# Patient Record
Sex: Female | Born: 1935 | Race: Black or African American | Hispanic: No | Marital: Married | Smoking: Former smoker
Health system: Southern US, Community
[De-identification: ages and names within clinical notes are randomized; demographics above are authoritative.]

## PROBLEM LIST (undated history)

## (undated) DIAGNOSIS — E785 Hyperlipidemia, unspecified: Secondary | ICD-10-CM

## (undated) DIAGNOSIS — I739 Peripheral vascular disease, unspecified: Secondary | ICD-10-CM

## (undated) DIAGNOSIS — E119 Type 2 diabetes mellitus without complications: Secondary | ICD-10-CM

## (undated) DIAGNOSIS — I10 Essential (primary) hypertension: Secondary | ICD-10-CM

## (undated) HISTORY — DX: Peripheral vascular disease, unspecified: I73.9

## (undated) HISTORY — PX: CARDIAC SURGERY: SHX584

## (undated) HISTORY — PX: FEMORAL-FEMORAL BYPASS GRAFT: SHX936

## (undated) HISTORY — DX: Type 2 diabetes mellitus without complications: E11.9

## (undated) HISTORY — PX: LEG SURGERY: SHX1003

## (undated) HISTORY — DX: Hyperlipidemia, unspecified: E78.5

## (undated) HISTORY — DX: Essential (primary) hypertension: I10

## (undated) HISTORY — PX: JOINT REPLACEMENT: SHX530

---

## 2003-07-05 ENCOUNTER — Other Ambulatory Visit: Payer: Self-pay

## 2007-11-07 ENCOUNTER — Inpatient Hospital Stay: Payer: Self-pay | Admitting: Internal Medicine

## 2007-11-07 ENCOUNTER — Other Ambulatory Visit: Payer: Self-pay

## 2009-09-21 ENCOUNTER — Inpatient Hospital Stay: Payer: Self-pay | Admitting: Internal Medicine

## 2009-10-02 ENCOUNTER — Ambulatory Visit: Payer: Self-pay | Admitting: Vascular Surgery

## 2009-10-10 ENCOUNTER — Ambulatory Visit: Payer: Self-pay | Admitting: Vascular Surgery

## 2009-10-20 ENCOUNTER — Inpatient Hospital Stay: Payer: Self-pay | Admitting: Vascular Surgery

## 2009-11-20 ENCOUNTER — Emergency Department: Payer: Self-pay | Admitting: Emergency Medicine

## 2010-01-01 ENCOUNTER — Ambulatory Visit: Payer: Self-pay | Admitting: Vascular Surgery

## 2011-02-05 IMAGING — CR DG C-ARM 1-60 MIN
6 series · 15 of 16 positions shown · non-contrast
Comparison: none

[[id] new series (1 of 6)]
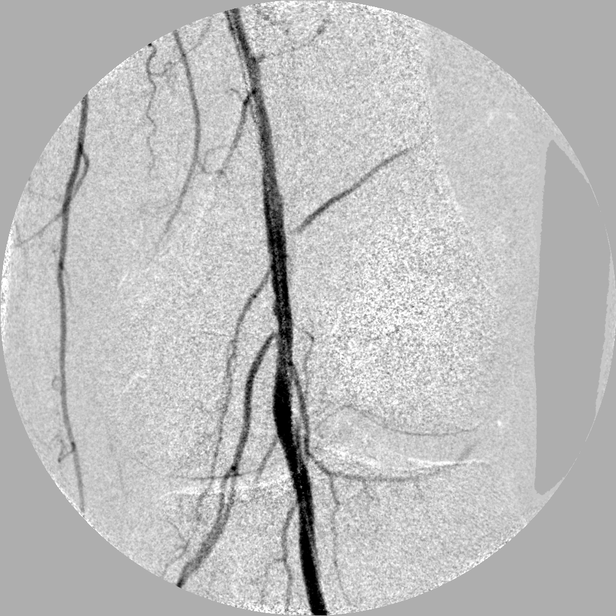

[[id] new series (2 of 6)]
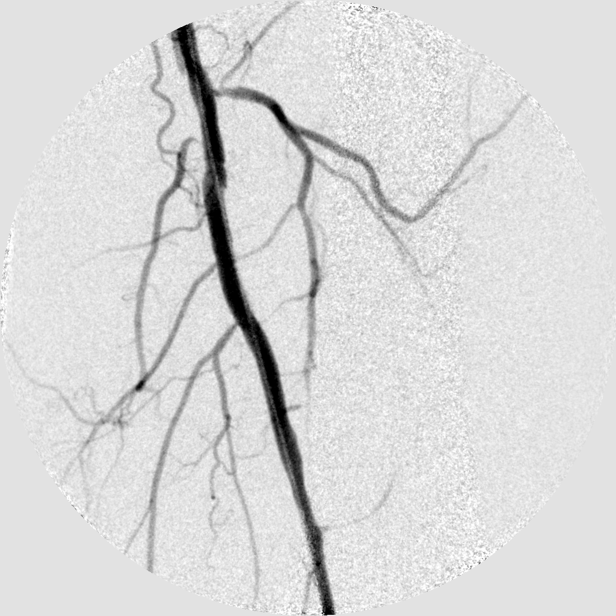

[Series 6003: (id) new series · 2 of 3 slices shown (3 of 6)]
[im 1/3]
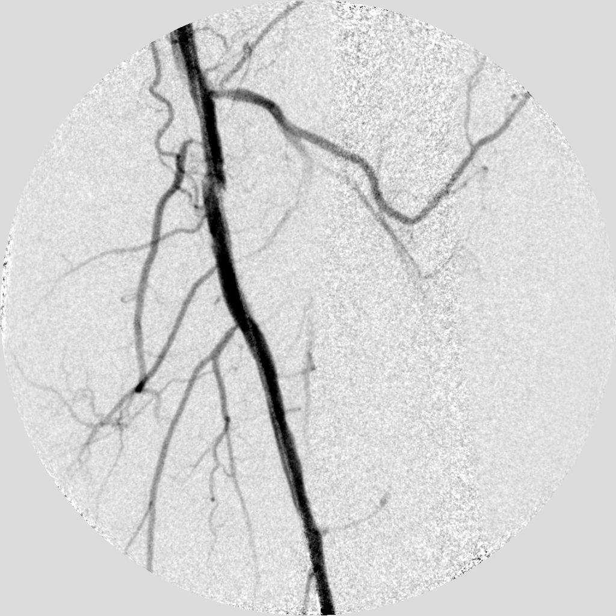
[im 3/3]
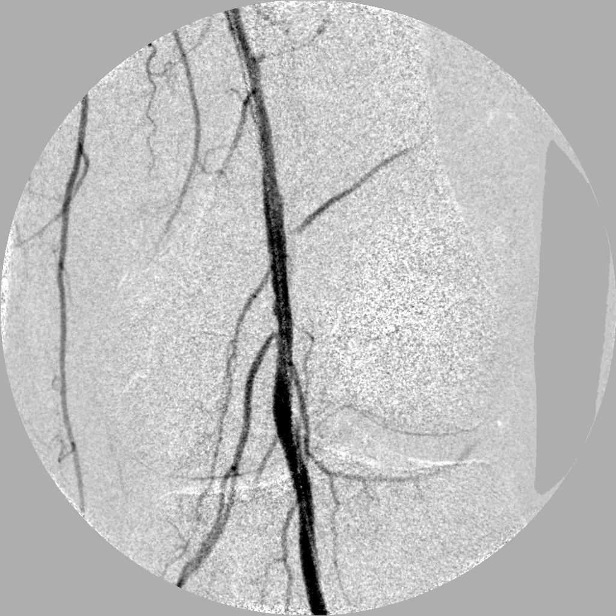

[Series 6004: (id) new series · 3 of 4 slices shown (4 of 6)]
[im 1/4]
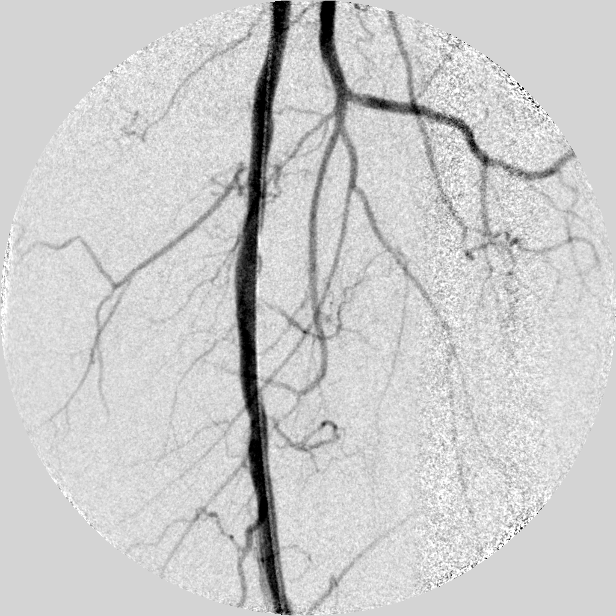
[im 2/4]
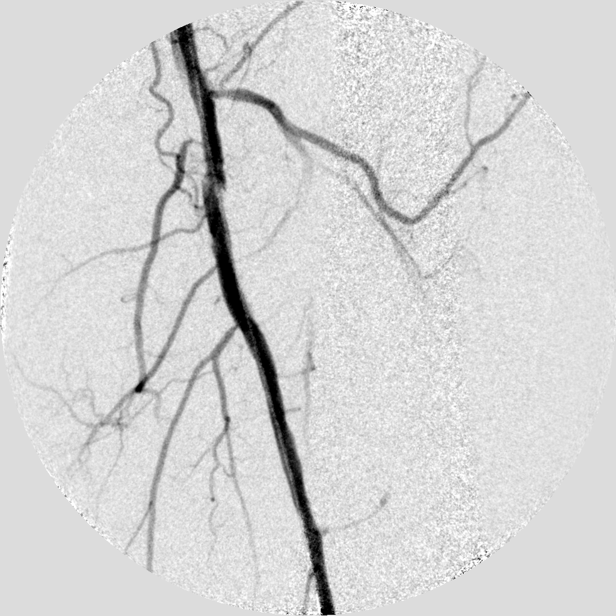
[im 4/4]
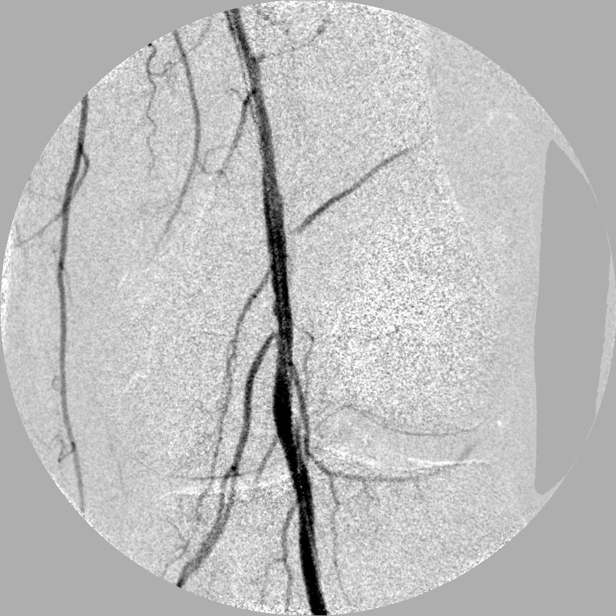

[Series 6005: (id) new series · 3 of 5 slices shown (5 of 6)]
[im 2/5]
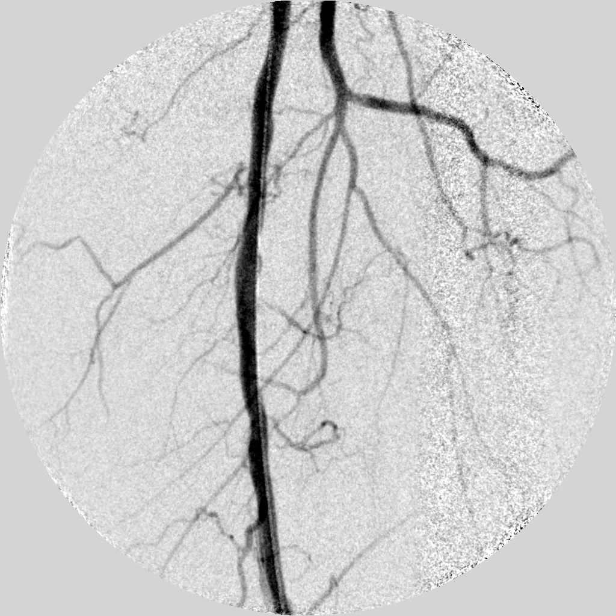
[im 3/5]
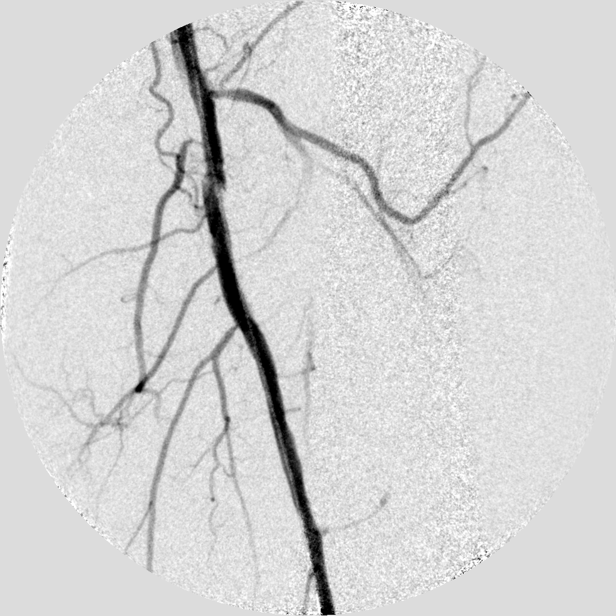
[im 5/5]
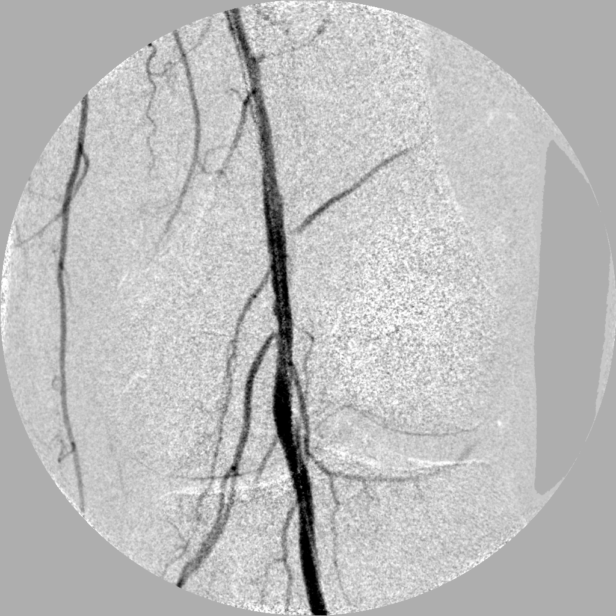

[Series 6006: (id) new series · 5 of 6 slices shown (6 of 6)]
[im 1/6]
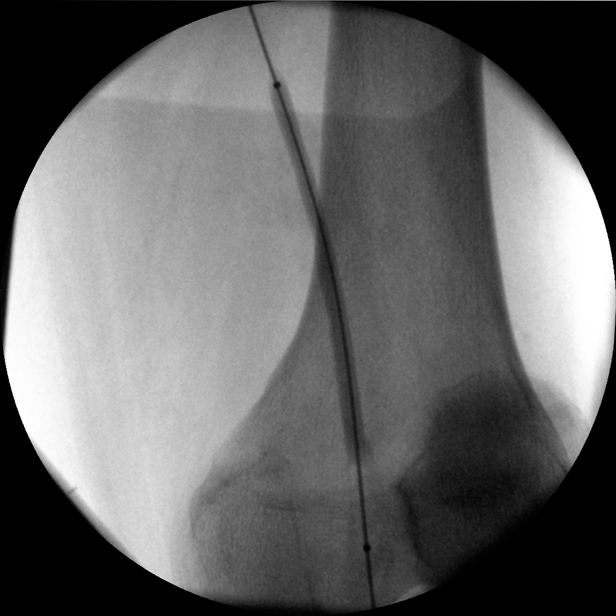
[im 2/6]
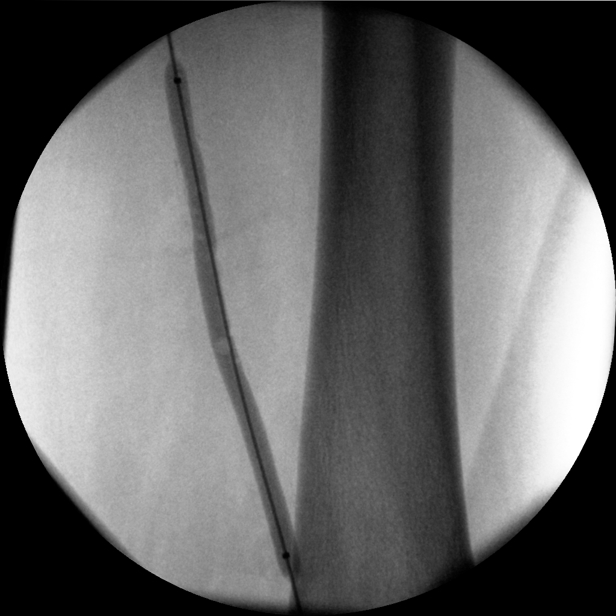
[im 3/6]
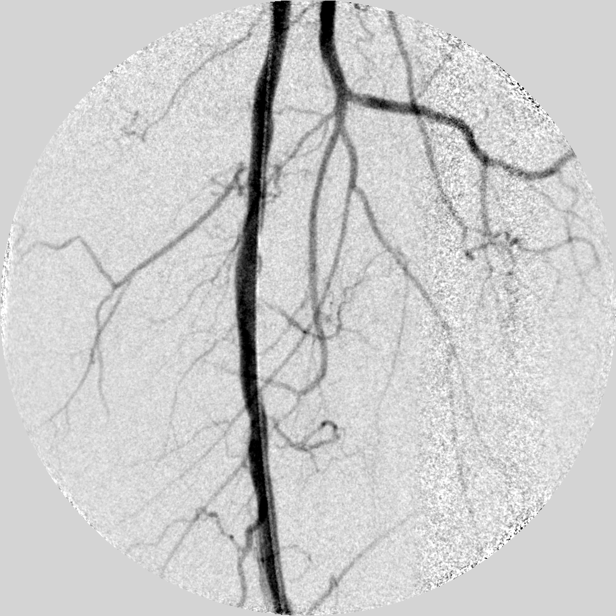
[im 4/6]
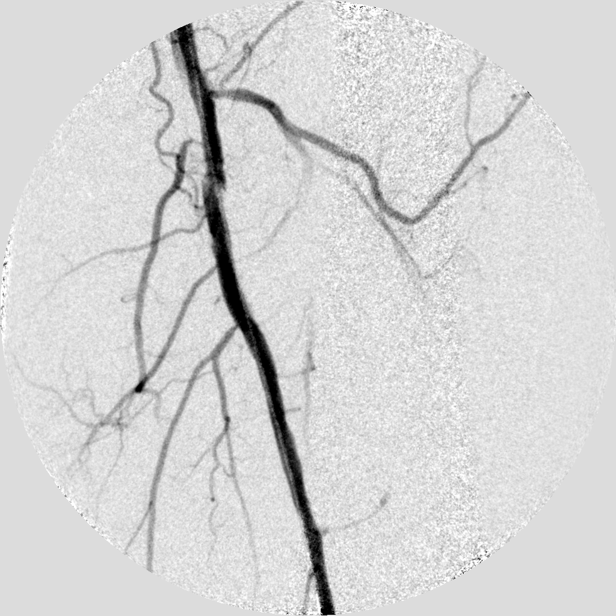
[im 6/6]
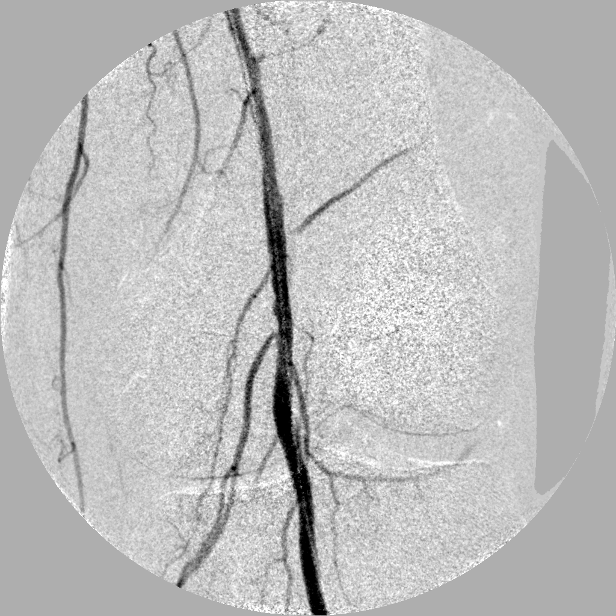

[15 of 16 positions shown; findings below may reference images not displayed]

IMAGES IMPORTED FROM THE [employer] WORKFLOW SYSTEM
NO DICTATION FOR STUDY

## 2011-02-06 ENCOUNTER — Ambulatory Visit: Payer: Self-pay | Admitting: Vascular Surgery

## 2011-07-15 ENCOUNTER — Ambulatory Visit: Payer: Self-pay | Admitting: General Practice

## 2011-07-15 LAB — URINALYSIS, COMPLETE
Bilirubin,UR: NEGATIVE
Blood: NEGATIVE
Glucose,UR: NEGATIVE mg/dL (ref 0–75)
Ph: 5 (ref 4.5–8.0)
Protein: NEGATIVE
RBC,UR: 6 /HPF (ref 0–5)
WBC UR: 8 /HPF (ref 0–5)

## 2011-07-15 LAB — BASIC METABOLIC PANEL
Anion Gap: 13 (ref 7–16)
BUN: 24 mg/dL — ABNORMAL HIGH (ref 7–18)
Chloride: 107 mmol/L (ref 98–107)
Co2: 22 mmol/L (ref 21–32)
EGFR (African American): 28 — ABNORMAL LOW
Glucose: 198 mg/dL — ABNORMAL HIGH (ref 65–99)
Potassium: 4.1 mmol/L (ref 3.5–5.1)
Sodium: 142 mmol/L (ref 136–145)

## 2011-07-15 LAB — CBC
HCT: 32.5 % — ABNORMAL LOW (ref 35.0–47.0)
HGB: 10.5 g/dL — ABNORMAL LOW (ref 12.0–16.0)
MCH: 24.5 pg — ABNORMAL LOW (ref 26.0–34.0)
MCHC: 32.4 g/dL (ref 32.0–36.0)
RDW: 15.3 % — ABNORMAL HIGH (ref 11.5–14.5)
WBC: 5 10*3/uL (ref 3.6–11.0)

## 2011-07-15 LAB — SEDIMENTATION RATE: Erythrocyte Sed Rate: 20 mm/hr (ref 0–30)

## 2011-07-15 LAB — PROTIME-INR
INR: 1
Prothrombin Time: 13.6 secs (ref 11.5–14.7)

## 2011-07-15 LAB — APTT: Activated PTT: 27 secs (ref 23.6–35.9)

## 2011-07-15 LAB — MRSA PCR SCREENING

## 2011-07-16 LAB — URINE CULTURE

## 2011-07-31 ENCOUNTER — Inpatient Hospital Stay: Payer: Self-pay | Admitting: General Practice

## 2011-08-01 LAB — BASIC METABOLIC PANEL
Chloride: 107 mmol/L (ref 98–107)
Co2: 21 mmol/L (ref 21–32)
Creatinine: 1.74 mg/dL — ABNORMAL HIGH (ref 0.60–1.30)
EGFR (Non-African Amer.): 27 — ABNORMAL LOW
Glucose: 171 mg/dL — ABNORMAL HIGH (ref 65–99)
Osmolality: 282 (ref 275–301)
Sodium: 138 mmol/L (ref 136–145)

## 2011-08-01 LAB — HEMOGLOBIN: HGB: 8.7 g/dL — ABNORMAL LOW (ref 12.0–16.0)

## 2011-08-01 LAB — PATHOLOGY REPORT

## 2011-08-01 LAB — PLATELET COUNT: Platelet: 133 10*3/uL — ABNORMAL LOW (ref 150–440)

## 2011-08-02 LAB — BASIC METABOLIC PANEL
Anion Gap: 9 (ref 7–16)
Calcium, Total: 8.9 mg/dL (ref 8.5–10.1)
Chloride: 108 mmol/L — ABNORMAL HIGH (ref 98–107)
Creatinine: 1.66 mg/dL — ABNORMAL HIGH (ref 0.60–1.30)
EGFR (African American): 33 — ABNORMAL LOW
Glucose: 128 mg/dL — ABNORMAL HIGH (ref 65–99)
Sodium: 140 mmol/L (ref 136–145)

## 2011-08-02 LAB — PLATELET COUNT: Platelet: 138 10*3/uL — ABNORMAL LOW (ref 150–440)

## 2011-08-06 ENCOUNTER — Encounter: Payer: Self-pay | Admitting: Internal Medicine

## 2011-08-19 ENCOUNTER — Encounter: Payer: Self-pay | Admitting: Internal Medicine

## 2014-06-12 NOTE — Discharge Summary (Signed)
PATIENT NAME:  Elizabeth [name], Elizabeth [name] MR#:  [id number] DATE OF BIRTH:  [birth year]  DATE OF ADMISSION:  07/31/2011 DATE OF DISCHARGE:  08/05/2011  ADDENDUM:  The patient was initially scheduled to be discharged from [facility] on Friday, 06/14, but due to bed availability not being available as expected, she was kept over the weekend and is being discharged on 06/17. There has been no change in her medical status over the weekend. There has been no change in her orders.    ____________________________ Van Clines, PA [email or web address]:bjt D: 08/05/2011 06:49:54 ET T: 08/05/2011 11:58:06 ET JOB#: [id number]  cc: Van Clines, PA, <Dictator> Evaleigh Mccamy PA ELECTRONICALLY SIGNED 08/08/2011 21:44

## 2014-06-12 NOTE — Op Note (Signed)
PATIENT NAME:  Elizabeth [name], Elizabeth [name] MR#:  [id number] DATE OF BIRTH:  [birth year]  DATE OF PROCEDURE:  07/31/2011  PREOPERATIVE DIAGNOSIS: Degenerative arthrosis of the right hip.   POSTOPERATIVE DIAGNOSIS: Degenerative arthrosis of the right hip.   PROCEDURE PERFORMED: Right total hip arthroplasty.   SURGEON: Illene Labrador. Angie Fava., MD   ASSISTANT: Van Clines, PA-C (required to maintain retraction throughout the procedure)   ANESTHESIA: Spinal.   ESTIMATED BLOOD LOSS: Minimal.   FLUIDS REPLACED: 2000 mL of crystalloid.   DRAINS: Two medium Hemovac drains.   IMPLANTS UTILIZED: DePuy 10.5 mm small stature AML femoral component, 50 mm outer diameter Pinnacle Sector II acetabular component, +4 mm 10-degree Pinnacle Marathon polyethylene liner, and a 32 mm cobalt chrome hip ball with a +1 mm neck length.   INDICATIONS FOR SURGERY: The patient is an 81 year old female who has been seen for complaints of progressive right hip and groin pain. X-rays demonstrated significant degenerative changes with bone on bone articulation. After discussion of the risks and benefits of surgical intervention, the patient expressed her understanding of the risks and benefits and agreed with plans for surgical intervention.   PROCEDURE IN DETAIL: The patient was brought into the Operating Room, and after adequate spinal anesthesia was achieved the patient was placed in the left lateral decubitus position. An axillary roll was placed and all bony prominences were well padded. The patient's right hip and leg were cleaned and prepped with alcohol and DuraPrep and draped in the usual sterile fashion. A "timeout" was performed as per usual protocol. A lateral curvilinear incision was made gently curving towards the posterior superior iliac spine. The IT band was incised in line with the skin incision, and the fibers of the gluteus maximus were split in line. The piriformis tendon was identified, skeletonized, and incised at its  insertion at the proximal femur and reflected posteriorly. In a similar fashion, the short external rotators were incised and reflected posteriorly. The gluteal sling was incised so as to better mobilize the proximal femur. A T-type posterior capsulotomy was performed. Prior to dislocation of the femoral head, a threaded Steinmann pin was inserted through a separate stab incision into the pelvis superior to the acetabulum and bent in the form of a stylus so as to assess limb length and hip offset throughout the procedure. The femoral head was then dislocated posteriorly. A posterior osteophyte was partially split with this procedure, but the posterior wall remained intact. Severe degenerative changes were noted to the femoral head. The femoral neck cut was performed using an oscillating saw. The anterior capsule was elevated off of the femoral neck. Inspection of the acetabulum also demonstrated significant degenerative changes with prominent osteophytes both anteriorly, posteriorly, and inferiorly. The acetabulum was reamed in a sequential fashion up to a 49 mm diameter. Good punctate bleeding bone was encountered. A 50 mm outer diameter Pinnacle Sector II acetabular component was positioned and impacted into place. Excellent scratch fit was appreciated. First a +4 neutral polyethylene trial was inserted and attention was directed to the proximal femur. A pilot hole for reaming of the proximal femoral canal was created using a high-speed bur. The proximal femoral canal was then reamed in a sequential fashion up to a 10 mm diameter. This allowed for greater than 6 cm of scratch fit. A 10.5 mm aggressive side-biting reamer was used to prepare the proximal femur, and a 10.5 mm small stature broach was inserted and seated at appropriate depth. The calcar region was planed accordingly,  and trial reduction was performed using a 32 mm trial with a +1 mm neck segment. Excellent equalization of limb lengths and hip offset  was appreciated. Good stability was appreciated with the exception of extremes of internal rotation, adduction, and flexion. A trial was subsequently attempted using a +4 10-degree liner with the high side positioned at approximately 7:00 position. This allowed for improved stability posteriorly. Trial components were removed. The acetabular shell was irrigated and suctioned dry. A +4 mm 10-degree Pinnacle Marathon polyethylene liner was positioned with the high side at 7:00 position and impacted into place. Next, a 10.5 mm small stature AML femoral component was positioned and impacted in place. Excellent scratch fit was appreciated. The Morse taper was cleaned and dried. A 32 mm outer diameter cobalt chrome hip ball with a +1 mm neck segment was placed on the trunnion and impacted into place. The hip was then reduced and placed through a range of motion. Excellent stability was appreciated both anteriorly and posteriorly. Good equalization of limb lengths was appreciated and good hip offset was appreciated.   The wound was irrigated with copious amounts of normal saline with antibiotic solution using pulsatile lavage and then suctioned dry. Good hemostasis was appreciated. Posterior capsulotomy was repaired using #5 Ethibond. A gluteal sling was also repaired using interrupted sutures of #5 Ethibond. Two medium drains were placed in the wound bed and brought out through a separate stab incision to be attached to a Hemovac reservoir. The IT band was repaired using interrupted sutures of #1 Vicryl. The subcutaneous tissue was approximated in layers using first #0 Vicryl, followed by #2-0 Vicryl. Skin was closed with skin staples. A sterile dressing was applied.   The patient tolerated the procedure well. She was transported to the recovery room in stable condition.    ____________________________ Illene Labrador. Angie Fava., MD jph:cbb D: 07/31/2011 10:45:53 ET T: 07/31/2011 11:27:02 ET JOB#: [id number]  cc: Fayrene Fearing  P. Angie Fava., MD, <Dictator> Illene Labrador Angie Fava MD ELECTRONICALLY SIGNED 08/09/2011 12:39

## 2014-06-12 NOTE — Discharge Summary (Signed)
PATIENT NAME:  Elizabeth, [name] MR#:  [id number] DATE OF BIRTH:  March 29, 1929  DATE OF ADMISSION:  07/31/2011 DATE OF DISCHARGE:  08/03/2011   ADMITTING DIAGNOSIS: Degenerative arthrosis of the right hip.   DISCHARGE DIAGNOSIS: Degenerative arthrosis of the right hip.   HISTORY: The patient is a pleasant 81 year old who has been followed at [facility] for progression of right hip and groin pain. She had reported approximately a two-year history of discomfort to this region. She had received intra-articular cortisone injections to the hip without any significant lasting improvement. She had complained that her pain was aggravated with weight-bearing activities although she has had some pain with rotation as well. She had noticed a decrease in her range of motion of the right hip. The patient was unable to tolerate anti-inflammatory medication due to being on Plavix. The pain had progressed to the point that it was significantly interfering with her activities of daily living. X-rays taken in the [facility] orthopedic department showed significant narrowing of the cartilage space with bone-on-bone articulation noted superiorly. There were cystic changes noted as well to the femoral head. Increased sclerotic formation was noted. She was also noted to have some osteophyte formation.   The patient stated that Dr. Wyn Quaker had stopped her Plavix as of February of this year.  She has been taking one 81-mg enteric-coated aspirin per day.   After discussion of the risks and benefits of surgical intervention, the patient expressed her understanding of the risks and benefits and agreed with plans for surgical intervention.   PROCEDURE: Right total hip arthroplasty.   ANESTHESIA: Spinal.   IMPLANTS UTILIZED: DePuy 10.5-mm small stature AML femoral component, 50-mm outer diameter Pinnacle sector-2 acetabular component, +4-mm 10-degree Pinnacle Marathon polyethylene liner, and a 32-mm cobalt chrome hip ball  with a +1-mm neck length.  HOSPITAL COURSE: The patient tolerated the procedure very well. She had no complications. She was then taken to the PAC-U where she was stabilized and then transferred to the orthopedic floor. The patient began receiving anticoagulation therapy per hospital pharmacy and anesthesia protocol. She was fitted with TED stockings bilaterally. These were allowed to be removed one hour per eight-hour shift. The patient was also fitted with the AV-I compression foot pumps bilaterally set at 80 mmHg. The patient's calves have been nontender, free of any evidence of any deep venous thromboses. Negative Homans sign. Heels were elevated off the bed using rolled towels.   The patient has denied any chest pain or shortness of breath. Vital signs have been stable. She has been afebrile. Hemodynamically she was stable. No transfusions were given.   The patient began receiving physical therapy on day one for gait training and transfers. She has been a little on the slow side with therapy and it was recommended that she go to rehab due to safety issues.  Occupational therapy was also initiated on day one for activities of daily living and assistive devices.   The patient's IV, Hemovac, and Foley were all discontinued on day two along with dressing change. The wound was free of any drainage or any signs of infection.   DISPOSITION: The patient is being discharged to a skilled nursing facility in improved stable condition.  DISCHARGE INSTRUCTIONS: 1. She is to weight bear as tolerated.  2. She will have occupational therapy for activities of daily living and assistive devices.  3. She will have physical therapy for gait training and transfers.  4. She is placed on an 1800-calorie ADA diet.  5. Encourage cough and deep breathing q. 2 hours while awake.  6. Incentive spirometer q.1 hour while awake.  7. TED stockings bilaterally. These are allowed to be removed one hour per eight-hour shift.   8. Abduction pillow between legs when in bed.  9. Elevate heels off the bed.  10. She has a follow-up appointment with Dr. Ernest Pine on 07/25 at 10:30. She is to call the clinic sooner if any temperatures of 101.5 or greater.   DRUG ALLERGIES: Demerol.   MEDICATIONS:  1. Tylenol ES 500 to 1000 mg q. 4 hours p.r.n.  2. Mylanta DS 30 mL q. 6 hours p.r.n.  3. Dulcolax suppositories 10 mg rectally daily p.r.n. for constipation.  4. Refresh optic  drops, 1 to 2 drops both eyes every four hours p.r.n. Colchicine 0.6 mg daily.  5. Senokot-S 1 tablet b.i.d. 6. Vasotec 20 mg daily.  7. Lovenox 30 mg subcutaneous every 12 hours per anesthesia protocol. Continue this for 14 days, then discontinue and begin taking one 81-mg enteric-coated aspirin.  8. Glucotrol 5 mg before breakfast.  9. Hydrochlorothiazide 12.5 mg daily.  10. Insulin sliding scale Novolin R injections. 11. Labetalol 100 mg daily.  12. Milk of Magnesia 30 mL b.i.d.  13. Toprol-XL 50 mg daily.  14. Roxicodone 5 to 10 mg q. 4 hours p.r.n. for pain. 15. Pantoprazole 40 mg b.i.d.  16. Pravachol 20 mg at bedtime.  17. Visine 0.05% optic drops, 1 to 2 drops both eyes p.r.n.  18. Tramadol 50 to 100 mg q. 4 hours p.r.n.  19. Ambien 10 mg at bedtime p.r.n.   PAST MEDICAL HISTORY:  1. Gout.  2. Osteoporosis.  3. Heart attack.  4. Ulcers.  5. Heart blockage.  6. Hypertension.  7. Diabetes.  8. Hypercholesterolemia.     ____________________________ Van Clines, PA jrw:bjt D: 08/02/2011 13:31:39 ET T: 08/02/2011 13:56:46 ET JOB#: [id number]  cc: Van Clines, PA, <Dictator> JON WOLFE PA ELECTRONICALLY SIGNED 08/08/2011 21:42

## 2016-02-23 ENCOUNTER — Encounter (INDEPENDENT_AMBULATORY_CARE_PROVIDER_SITE_OTHER): Payer: Self-pay

## 2016-02-23 ENCOUNTER — Ambulatory Visit (INDEPENDENT_AMBULATORY_CARE_PROVIDER_SITE_OTHER): Payer: Self-pay | Admitting: Vascular Surgery

## 2016-02-28 ENCOUNTER — Ambulatory Visit (INDEPENDENT_AMBULATORY_CARE_PROVIDER_SITE_OTHER): Payer: Medicare Other | Admitting: Vascular Surgery

## 2016-02-28 ENCOUNTER — Other Ambulatory Visit (INDEPENDENT_AMBULATORY_CARE_PROVIDER_SITE_OTHER): Payer: Self-pay | Admitting: Vascular Surgery

## 2016-02-28 ENCOUNTER — Encounter (INDEPENDENT_AMBULATORY_CARE_PROVIDER_SITE_OTHER): Payer: Self-pay | Admitting: Vascular Surgery

## 2016-02-28 ENCOUNTER — Other Ambulatory Visit (INDEPENDENT_AMBULATORY_CARE_PROVIDER_SITE_OTHER): Payer: Medicare Other

## 2016-02-28 ENCOUNTER — Encounter (INDEPENDENT_AMBULATORY_CARE_PROVIDER_SITE_OTHER): Payer: Medicare Other

## 2016-02-28 VITALS — BP 148/49 | HR 56 | Resp 16 | Ht 64.0 in | Wt 172.0 lb

## 2016-02-28 DIAGNOSIS — I714 Abdominal aortic aneurysm, without rupture, unspecified: Secondary | ICD-10-CM

## 2016-02-28 DIAGNOSIS — I70219 Atherosclerosis of native arteries of extremities with intermittent claudication, unspecified extremity: Secondary | ICD-10-CM | POA: Diagnosis not present

## 2016-02-28 DIAGNOSIS — I159 Secondary hypertension, unspecified: Secondary | ICD-10-CM

## 2016-02-28 DIAGNOSIS — E1151 Type 2 diabetes mellitus with diabetic peripheral angiopathy without gangrene: Secondary | ICD-10-CM

## 2016-02-28 DIAGNOSIS — I739 Peripheral vascular disease, unspecified: Secondary | ICD-10-CM | POA: Diagnosis not present

## 2016-02-28 DIAGNOSIS — E785 Hyperlipidemia, unspecified: Secondary | ICD-10-CM | POA: Diagnosis not present

## 2016-02-28 DIAGNOSIS — I70209 Unspecified atherosclerosis of native arteries of extremities, unspecified extremity: Secondary | ICD-10-CM | POA: Diagnosis not present

## 2016-02-28 NOTE — Progress Notes (Signed)
Subjective:    Patient ID: Elizabeth [name], female    DOB: 1929/11/25, 83 y.o.   MRN: [id number] Chief Complaint  Patient presents with  . Re-evaluation    Ultrasound follow up   Patient presents for a yearly lower extremity atherosclerotic disease follow up. The patient underwent an ABI which showed moderate occlusive disease bilaterally - this is stable from last year. An aorto-iliac arterial duplex is notable for patent right to left fem-fem and known AAA measuring 3.6cm - this is also stable. The patient denies any claudication like symptoms, rest pain or ulcers to her feet / toes.    Review of Systems  Constitutional: Negative.   HENT: Negative.   Eyes: Negative.   Respiratory: Negative.   Cardiovascular: Negative.   Gastrointestinal: Negative.   Endocrine: Negative.   Genitourinary: Negative.   Musculoskeletal: Negative.   Skin: Negative.   Allergic/Immunologic: Negative.   Neurological: Negative.   Hematological: Negative.   Psychiatric/Behavioral: Negative.       Objective:   Physical Exam  Constitutional: She is oriented to person, place, and time. She appears well-developed and well-nourished.  HENT:  Head: Normocephalic and atraumatic.  Right Ear: External ear normal.  Left Ear: External ear normal.  Eyes: Conjunctivae and EOM are normal. Pupils are equal, round, and reactive to light.  Neck: Normal range of motion.  Cardiovascular: Normal rate, regular rhythm, normal heart sounds and intact distal pulses.   Pulses:      Radial pulses are 2+ on the right side, and 2+ on the left side.       Dorsalis pedis pulses are 1+ on the right side, and 1+ on the left side.       Posterior tibial pulses are 1+ on the right side, and 1+ on the left side.  Pulmonary/Chest: Effort normal and breath sounds normal.  Abdominal: Soft. Bowel sounds are normal.  Musculoskeletal: Normal range of motion. She exhibits no edema.  Neurological: She is alert and oriented to person,  place, and time.  Skin: Skin is warm and dry.  Psychiatric: She has a normal mood and affect. Her behavior is normal. Judgment and thought content normal.   BP (!) 148/49   Pulse (!) 56   Resp 16   Ht 5\' 4"  (1.626 m)   Wt 172 lb (78 kg)   BMI 29.52 kg/m   Past Medical History:  Diagnosis Date  . Diabetes mellitus without complication (HCC)   . Hyperlipidemia   . Hypertension   . Peripheral arterial disease (HCC)     Social History   Social History  . Marital status: Married    Spouse name: N/A  . Number of children: N/A  . Years of education: N/A   Occupational History  . Not on file.   Social History Main Topics  . Smoking status: Former Games developer  . Smokeless tobacco: Never Used  . Alcohol use No  . Drug use: Unknown  . Sexual activity: Not on file   Other Topics Concern  . Not on file   Social History Narrative  . No narrative on file    No past surgical history on file.  Family History  Problem Relation Age of Onset  . Diabetes Mother     Allergies  Allergen Reactions  . Meperidine Nausea Only       Assessment & Plan:  Patient presents for a yearly lower extremity atherosclerotic disease follow up. The patient underwent an ABI which showed moderate occlusive disease  bilaterally - this is stable from last year. An aorto-iliac arterial duplex is notable for patent right to left fem-fem and known AAA measuring 3.6cm - this is also stable. The patient denies any claudication like symptoms, rest pain or ulcers to her feet / toes.   1. PAD (peripheral artery disease) (HCC) - Stable Patient asymptomatic with stable ABI and Aorto-iliac. No indication for intervention at this time.  Patient to remain abstinent of tobacco use. I have discussed with the patient at length the risk factors for and pathogenesis of atherosclerotic disease and encouraged a healthy diet, regular exercise regimen and blood pressure / glucose control.  The patient was encouraged to  call the office in the interim if he experiences any claudication like symptoms, rest pain or ulcers to his feet / toes.  - VAS [place] ABI WITH/WO TBI; Future - VAS US AORTA/IVC/ILIACS; Future  2. AAA (abdominal aortic aneurysm) without rupture (HCC) - Stable Patient asymptomatic with no growth on duplex. <4.0cm - yearly follow up indicated The patient has an asymptomatic abdominal aortic aneurysm that is less than 4 cm in maximal diameter.  I have reviewed the natural history of abdominal aortic aneurysm and the small risk of rupture for aneurysm less than 5 cm in size.  However, as these small aneurysms tend to enlarge over time, continued surveillance with ultrasound or CT scan is mandatory.  The patient's blood pressure is being adequately controlled however I have reviewed the importance of hypertension and lipid control and the importance of continuing his abstinence from tobacco.  The patient is also encouraged to exercise a minimum of 30 minutes 4 times a week.  Should the patient develop new onset abdominal or back pain or signs of peripheral embolization they are instructed to seek medical attention immediately and to alert the physician providing care that they have an aneurysm.  The patient voices their understanding.  3. Hyperlipidemia, unspecified hyperlipidemia type - Stable Encouraged good control as its slows the progression of atherosclerotic disease  4. 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 Secondary hypertension - Stable Encouraged good control as its slows the progression of atherosclerotic disease  No current outpatient prescriptions on file prior to visit.   No current facility-administered medications on file prior to visit.     There are no Patient Instructions on file for this visit. No Follow-up on file.   Shamia Uppal A Caidon Foti,  PA-C

## 2017-03-05 ENCOUNTER — Ambulatory Visit (INDEPENDENT_AMBULATORY_CARE_PROVIDER_SITE_OTHER): Payer: Medicare Other | Admitting: Vascular Surgery

## 2017-03-05 ENCOUNTER — Encounter (INDEPENDENT_AMBULATORY_CARE_PROVIDER_SITE_OTHER): Payer: Self-pay | Admitting: Vascular Surgery

## 2017-03-05 ENCOUNTER — Other Ambulatory Visit (INDEPENDENT_AMBULATORY_CARE_PROVIDER_SITE_OTHER): Payer: Medicare Other

## 2017-03-05 VITALS — BP 131/52 | HR 60 | Resp 16 | Wt 165.0 lb

## 2017-03-05 DIAGNOSIS — I714 Abdominal aortic aneurysm, without rupture, unspecified: Secondary | ICD-10-CM

## 2017-03-05 DIAGNOSIS — I739 Peripheral vascular disease, unspecified: Secondary | ICD-10-CM

## 2017-03-05 DIAGNOSIS — I70209 Unspecified atherosclerosis of native arteries of extremities, unspecified extremity: Secondary | ICD-10-CM | POA: Diagnosis not present

## 2017-03-05 DIAGNOSIS — E785 Hyperlipidemia, unspecified: Secondary | ICD-10-CM | POA: Diagnosis not present

## 2017-03-05 DIAGNOSIS — E1151 Type 2 diabetes mellitus with diabetic peripheral angiopathy without gangrene: Secondary | ICD-10-CM | POA: Diagnosis not present

## 2017-03-05 NOTE — Progress Notes (Signed)
Subjective:    Patient ID: Elizabeth [name], female    DOB: 01/23/1930, 84 y.o.   MRN: [id number] Chief Complaint  Patient presents with  . Follow-up    26yr abi,aorta iliac   Patient presents for a yearly peripheral artery disease follow-up.  The patient presents today without complaint.  The patient denies any claudication-like symptoms, rest pain or ulceration to the bilateral lower extremity.  The patient underwent a bilateral ABI which was notable for moderate bilateral lower extremity arterial disease.  When compared to the previous exam there is no significant change.  The patient underwent an aortoiliac arterial duplex which was notable for an abdominal aortic aneurysm measuring 3.6 cm.  This is unchanged when compared to the previous exam on February 28, 2016.  There is a patent right to left femoral bypass graft with a focal velocity increase near the distal anastomosis.  Elevated velocities in the right external iliac which may be due to a known left iliac artery occlusion.  Patient denies any fever, nausea vomiting.   Review of Systems  Constitutional: Negative.   HENT: Negative.   Eyes: Negative.   Respiratory: Negative.   Cardiovascular: Negative.   Gastrointestinal: Negative.   Endocrine: Negative.   Genitourinary: Negative.   Musculoskeletal: Negative.   Skin: Negative.   Allergic/Immunologic: Negative.   Neurological: Negative.   Hematological: Negative.   Psychiatric/Behavioral: Negative.       Objective:   Physical Exam  Constitutional: She is oriented to person, place, and time. She appears well-developed and well-nourished. No distress.  HENT:  Head: Normocephalic and atraumatic.  Eyes: Conjunctivae are normal. Pupils are equal, round, and reactive to light.  Neck: Normal range of motion.  Cardiovascular: Normal rate, regular rhythm, normal heart sounds and intact distal pulses.  Pulses:      Radial pulses are 2+ on the right side, and 2+ on the left side.     Dorsalis pedis pulses are 1+ on the right side, and 1+ on the left side.       Posterior tibial pulses are 1+ on the right side, and 1+ on the left side.  Pulmonary/Chest: Effort normal and breath sounds normal.  Musculoskeletal: Normal range of motion. She exhibits edema (Minimal nonpitting bilateral edema noted).  Neurological: She is alert and oriented to person, place, and time.  Skin: Skin is warm and dry. She is not diaphoretic.  Psychiatric: She has a normal mood and affect. Her behavior is normal. Judgment and thought content normal.  Vitals reviewed.  BP (!) 131/52 (BP Location: Right Arm)   Pulse 60   Resp 16   Wt 165 lb (74.8 kg)   BMI 28.32 kg/m   Past Medical History:  Diagnosis Date  . Diabetes mellitus without complication (HCC)   . Hyperlipidemia   . Hypertension   . Peripheral arterial disease (HCC)    Social History   Socioeconomic History  . Marital status: Married    Spouse name: Not on file  . Number of children: Not on file  . Years of education: Not on file  . Highest education level: Not on file  Social Needs  . Financial resource strain: Not on file  . Food insecurity - worry: Not on file  . Food insecurity - inability: Not on file  . Transportation needs - medical: Not on file  . Transportation needs - non-medical: Not on file  Occupational History  . Not on file  Tobacco Use  . Smoking status: Former  Smoker    Types: Cigarettes  . Smokeless tobacco: Never Used  Substance and Sexual Activity  . Alcohol use: No  . Drug use: No  . Sexual activity: Not on file  Other Topics Concern  . Not on file  Social History Narrative  . Not on file   Past Surgical History:  Procedure Laterality Date  . CARDIAC SURGERY     triple bypass  . FEMORAL-FEMORAL BYPASS GRAFT    . JOINT REPLACEMENT     right hip  . LEG SURGERY Left    leg bypass   Family History  Problem Relation Age of Onset  . Diabetes Mother    Allergies  Allergen Reactions    . Meperidine Nausea Only      Assessment & Plan:  Patient presents for a yearly peripheral artery disease follow-up.  The patient presents today without complaint.  The patient denies any claudication-like symptoms, rest pain or ulceration to the bilateral lower extremity.  The patient underwent a bilateral ABI which was notable for moderate bilateral lower extremity arterial disease.  When compared to the previous exam there is no significant change.  The patient underwent an aortoiliac arterial duplex which was notable for an abdominal aortic aneurysm measuring 3.6 cm.  This is unchanged when compared to the previous exam on February 28, 2016.  There is a patent right to left femoral bypass graft with a focal velocity increase near the distal anastomosis.  Elevated velocities in the right external iliac which may be due to a known left iliac artery occlusion.  Patient denies any fever, nausea vomiting.  1. AAA (abdominal aortic aneurysm) without rupture (HCC) - Stable Patient with a stable abdominal aortic aneurysm measuring 3.6 cm The patient denies any abdominal pain, back pain or thrombosis to the bilateral lower extremity. We discussed how hypertension control is an important aspect in controlling the growth of her abdominal aneurysm The patient expresses her understanding  2. PAD (peripheral artery disease) (HCC) - Stable The patient has known peripheral artery disease Patent right to left femorofemoral femoral bypass Patient with stable disease to the lower extremity she presents today asymptomatically. We will continue to follow on a yearly basis. The patient was encouraged to call the office sooner if she should experience any claudication-like symptoms, rest pain or ulceration to the bilateral lower extremity  - VAS [place] ABI WITH/WO TBI; Future - VAS [place] LOWER EXTREMITY ARTERIAL DUPLEX; Future  3. 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 Hyperlipidemia, unspecified hyperlipidemia type - stable Encouraged good control as its slows the progression of atherosclerotic disease   Current Outpatient Medications on File Prior to Visit  Medication Sig Dispense Refill  . aspirin EC 81 MG tablet Take by mouth.    . Calcium Carb-Cholecalciferol (CALCIUM-VITAMIN D) 500-200 MG-UNIT tablet Take by mouth.    . colchicine 0.6 MG tablet Take 2 tablets (1.2mg ) by mouth at first sign of gout flare followed by 1 tablet (0.6mg ) after 1 hour. (Max 1.8mg  within 1 hour)    . enalapril (VASOTEC) 20 MG tablet Take by mouth.    Marland Kitchen glipiZIDE (GLUCOTROL) 5 MG tablet TAKE 1 TABLET BY MOUTH EVERY MORNING BEFORE BREAKFAST.    . hydrochlorothiazide (MICROZIDE) 12.5 MG capsule TAKE 1 CAPSULE BY MOUTH ONCE DAILY.    Marland Kitchen labetalol (NORMODYNE) 100 MG tablet Take by mouth.    . metoprolol succinate (  TOPROL-XL) 50 MG 24 hr tablet TAKE 1 TABLET BY MOUTH ONCE DAILY.    . pravastatin (PRAVACHOL) 20 MG tablet Take by mouth.    . niacin (NIASPAN) 500 MG CR tablet Take by mouth.     No current facility-administered medications on file prior to visit.    There are no Patient Instructions on file for this visit. No Follow-up on file.  KIMBERLY A STEGMAYER, PA-C

## 2017-07-14 ENCOUNTER — Emergency Department: Payer: Medicare Other

## 2017-07-14 ENCOUNTER — Encounter: Payer: Self-pay | Admitting: Emergency Medicine

## 2017-07-14 ENCOUNTER — Other Ambulatory Visit: Payer: Self-pay

## 2017-07-14 ENCOUNTER — Inpatient Hospital Stay
Admission: EM | Admit: 2017-07-14 | Discharge: 2017-07-16 | DRG: 641 | Disposition: A | Discharge status: Home Health | Payer: Medicare Other | Attending: Family Medicine | Admitting: Family Medicine

## 2017-07-14 DIAGNOSIS — Z7982 Long term (current) use of aspirin: Secondary | ICD-10-CM

## 2017-07-14 DIAGNOSIS — I129 Hypertensive chronic kidney disease with stage 1 through stage 4 chronic kidney disease, or unspecified chronic kidney disease: Secondary | ICD-10-CM | POA: Diagnosis present

## 2017-07-14 DIAGNOSIS — E1122 Type 2 diabetes mellitus with diabetic chronic kidney disease: Secondary | ICD-10-CM | POA: Diagnosis present

## 2017-07-14 DIAGNOSIS — Z87891 Personal history of nicotine dependence: Secondary | ICD-10-CM | POA: Diagnosis not present

## 2017-07-14 DIAGNOSIS — I2510 Atherosclerotic heart disease of native coronary artery without angina pectoris: Secondary | ICD-10-CM | POA: Diagnosis present

## 2017-07-14 DIAGNOSIS — L8990 Pressure ulcer of unspecified site, unspecified stage: Secondary | ICD-10-CM

## 2017-07-14 DIAGNOSIS — Z8673 Personal history of transient ischemic attack (TIA), and cerebral infarction without residual deficits: Secondary | ICD-10-CM

## 2017-07-14 DIAGNOSIS — M109 Gout, unspecified: Secondary | ICD-10-CM | POA: Diagnosis present

## 2017-07-14 DIAGNOSIS — L89151 Pressure ulcer of sacral region, stage 1: Secondary | ICD-10-CM | POA: Diagnosis present

## 2017-07-14 DIAGNOSIS — E871 Hypo-osmolality and hyponatremia: Secondary | ICD-10-CM | POA: Diagnosis present

## 2017-07-14 DIAGNOSIS — N179 Acute kidney failure, unspecified: Secondary | ICD-10-CM | POA: Diagnosis present

## 2017-07-14 DIAGNOSIS — N184 Chronic kidney disease, stage 4 (severe): Secondary | ICD-10-CM | POA: Diagnosis present

## 2017-07-14 DIAGNOSIS — Z96641 Presence of right artificial hip joint: Secondary | ICD-10-CM | POA: Diagnosis present

## 2017-07-14 DIAGNOSIS — R55 Syncope and collapse: Secondary | ICD-10-CM | POA: Diagnosis not present

## 2017-07-14 DIAGNOSIS — E1151 Type 2 diabetes mellitus with diabetic peripheral angiopathy without gangrene: Secondary | ICD-10-CM | POA: Diagnosis present

## 2017-07-14 DIAGNOSIS — E785 Hyperlipidemia, unspecified: Secondary | ICD-10-CM | POA: Diagnosis present

## 2017-07-14 DIAGNOSIS — Z79899 Other long term (current) drug therapy: Secondary | ICD-10-CM

## 2017-07-14 DIAGNOSIS — E860 Dehydration: Secondary | ICD-10-CM | POA: Diagnosis present

## 2017-07-14 LAB — CBC WITH DIFFERENTIAL/PLATELET
Basophils Absolute: 0 10*3/uL (ref 0–0.1)
Basophils Relative: 1 %
Eosinophils Absolute: 0.1 10*3/uL (ref 0–0.7)
Eosinophils Relative: 1 %
HEMATOCRIT: 34.2 % — AB (ref 35.0–47.0)
HEMOGLOBIN: 11.4 g/dL — AB (ref 12.0–16.0)
LYMPHS ABS: 1.8 10*3/uL (ref 1.0–3.6)
LYMPHS PCT: 27 %
MCH: 25.7 pg — ABNORMAL LOW (ref 26.0–34.0)
MCHC: 33.3 g/dL (ref 32.0–36.0)
MCV: 77.1 fL — AB (ref 80.0–100.0)
MONOS PCT: 13 %
Monocytes Absolute: 0.9 10*3/uL (ref 0.2–0.9)
NEUTROS ABS: 3.9 10*3/uL (ref 1.4–6.5)
NEUTROS PCT: 58 %
Platelets: 182 10*3/uL (ref 150–440)
RBC: 4.43 MIL/uL (ref 3.80–5.20)
RDW: 14.5 % (ref 11.5–14.5)
WBC: 6.7 10*3/uL (ref 3.6–11.0)

## 2017-07-14 LAB — COMPREHENSIVE METABOLIC PANEL
ALBUMIN: 4.1 g/dL (ref 3.5–5.0)
ALK PHOS: 55 U/L (ref 38–126)
ALT: 21 U/L (ref 14–54)
ANION GAP: 12 (ref 5–15)
AST: 23 U/L (ref 15–41)
BUN: 43 mg/dL — ABNORMAL HIGH (ref 6–20)
CHLORIDE: 92 mmol/L — AB (ref 101–111)
CO2: 22 mmol/L (ref 22–32)
Calcium: 9.6 mg/dL (ref 8.9–10.3)
Creatinine, Ser: 2.39 mg/dL — ABNORMAL HIGH (ref 0.44–1.00)
GFR calc non Af Amer: 17 mL/min — ABNORMAL LOW (ref >60)
GFR, EST AFRICAN AMERICAN: 20 mL/min — AB (ref >60)
GLUCOSE: 97 mg/dL (ref 65–99)
Potassium: 4.3 mmol/L (ref 3.5–5.1)
Sodium: 126 mmol/L — ABNORMAL LOW (ref 135–145)
Total Bilirubin: 0.9 mg/dL (ref 0.3–1.2)
Total Protein: 6.8 g/dL (ref 6.5–8.1)

## 2017-07-14 LAB — CREATININE, SERUM
CREATININE: 2.52 mg/dL — AB (ref 0.44–1.00)
GFR calc Af Amer: 19 mL/min — ABNORMAL LOW (ref >60)
GFR calc non Af Amer: 16 mL/min — ABNORMAL LOW (ref >60)

## 2017-07-14 LAB — CBC
HCT: 35.8 % (ref 35.0–47.0)
HEMOGLOBIN: 11.9 g/dL — AB (ref 12.0–16.0)
MCH: 25.5 pg — AB (ref 26.0–34.0)
MCHC: 33.3 g/dL (ref 32.0–36.0)
MCV: 76.5 fL — AB (ref 80.0–100.0)
PLATELETS: 169 10*3/uL (ref 150–440)
RBC: 4.68 MIL/uL (ref 3.80–5.20)
RDW: 14.5 % (ref 11.5–14.5)
WBC: 5.9 10*3/uL (ref 3.6–11.0)

## 2017-07-14 LAB — TROPONIN I: Troponin I: 0.03 ng/mL (ref <0.03)

## 2017-07-14 LAB — TSH: TSH: 0.674 u[IU]/mL (ref 0.350–4.500)

## 2017-07-14 LAB — GLUCOSE, CAPILLARY: Glucose-Capillary: 146 mg/dL — ABNORMAL HIGH (ref 65–99)

## 2017-07-14 MED ORDER — COLCHICINE 0.6 MG PO TABS: 0.6000 mg | ORAL_TABLET | Freq: Every day | ORAL | Status: DC

## 2017-07-14 MED ORDER — ONDANSETRON HCL 4 MG PO TABS: 4.0000 mg | ORAL_TABLET | Freq: Four times a day (QID) | ORAL | Status: DC | PRN

## 2017-07-14 MED ORDER — LABETALOL HCL 100 MG PO TABS
100.0000 mg | ORAL_TABLET | Freq: Every day | ORAL | Status: DC
Start: 2017-07-15 — End: 2017-07-15
  Administered 2017-07-15: 100 mg via ORAL
  Filled 2017-07-14: qty 1

## 2017-07-14 MED ORDER — HEPARIN SODIUM (PORCINE) 5000 UNIT/ML IJ SOLN
5000.0000 [IU] | Freq: Three times a day (TID) | INTRAMUSCULAR | Status: DC
  Administered 2017-07-15 – 2017-07-16 (×4): 5000 [IU] via SUBCUTANEOUS
  Filled 2017-07-14 (×4): qty 1

## 2017-07-14 MED ORDER — SODIUM CHLORIDE 0.9 % IV SOLN
INTRAVENOUS | Status: DC
  Administered 2017-07-15 (×2): via INTRAVENOUS

## 2017-07-14 MED ORDER — NIACIN ER (ANTIHYPERLIPIDEMIC) 500 MG PO TBCR
500.0000 mg | EXTENDED_RELEASE_TABLET | Freq: Every day | ORAL | Status: DC
  Administered 2017-07-15 (×2): 500 mg via ORAL
  Filled 2017-07-14 (×3): qty 1

## 2017-07-14 MED ORDER — ASPIRIN EC 81 MG PO TBEC
81.0000 mg | DELAYED_RELEASE_TABLET | Freq: Every day | ORAL | Status: DC
  Administered 2017-07-15 – 2017-07-16 (×2): 81 mg via ORAL
  Filled 2017-07-14 (×2): qty 1

## 2017-07-14 MED ORDER — GLIPIZIDE 5 MG PO TABS
5.0000 mg | ORAL_TABLET | Freq: Every day | ORAL | Status: DC
  Administered 2017-07-15: 5 mg via ORAL
  Filled 2017-07-14: qty 1

## 2017-07-14 MED ORDER — SODIUM CHLORIDE 0.9 % IV BOLUS
500.0000 mL | Freq: Once | INTRAVENOUS | Status: AC
  Administered 2017-07-14: 500 mL via INTRAVENOUS

## 2017-07-14 MED ORDER — CALCIUM CARBONATE-VITAMIN D 500-200 MG-UNIT PO TABS
1.0000 | ORAL_TABLET | Freq: Two times a day (BID) | ORAL | Status: DC
  Administered 2017-07-15 – 2017-07-16 (×4): 1 via ORAL
  Filled 2017-07-14 (×4): qty 1

## 2017-07-14 MED ORDER — ACETAMINOPHEN 650 MG RE SUPP: 650.0000 mg | Freq: Four times a day (QID) | RECTAL | Status: DC | PRN

## 2017-07-14 MED ORDER — ONDANSETRON HCL 4 MG/2ML IJ SOLN: 4.0000 mg | Freq: Four times a day (QID) | INTRAMUSCULAR | Status: DC | PRN

## 2017-07-14 MED ORDER — INSULIN ASPART 100 UNIT/ML ~~LOC~~ SOLN: 0.0000 [IU] | Freq: Every day | SUBCUTANEOUS | Status: DC

## 2017-07-14 MED ORDER — EZETIMIBE 10 MG PO TABS
10.0000 mg | ORAL_TABLET | Freq: Every day | ORAL | Status: DC
  Administered 2017-07-15 – 2017-07-16 (×2): 10 mg via ORAL
  Filled 2017-07-14 (×2): qty 1

## 2017-07-14 MED ORDER — SENNOSIDES-DOCUSATE SODIUM 8.6-50 MG PO TABS: 1.0000 | ORAL_TABLET | Freq: Every evening | ORAL | Status: DC | PRN

## 2017-07-14 MED ORDER — BISACODYL 10 MG RE SUPP: 10.0000 mg | Freq: Every day | RECTAL | Status: DC | PRN

## 2017-07-14 MED ORDER — INSULIN ASPART 100 UNIT/ML ~~LOC~~ SOLN
0.0000 [IU] | Freq: Three times a day (TID) | SUBCUTANEOUS | Status: DC
  Administered 2017-07-15: 1 [IU] via SUBCUTANEOUS
  Administered 2017-07-16: 2 [IU] via SUBCUTANEOUS
  Filled 2017-07-14 (×3): qty 1

## 2017-07-14 MED ORDER — HYDROCODONE-ACETAMINOPHEN 5-325 MG PO TABS: 1.0000 | ORAL_TABLET | ORAL | Status: DC | PRN

## 2017-07-14 MED ORDER — ACETAMINOPHEN 325 MG PO TABS: 650.0000 mg | ORAL_TABLET | Freq: Four times a day (QID) | ORAL | Status: DC | PRN

## 2017-07-14 MED ORDER — FLEET ENEMA 7-19 GM/118ML RE ENEM: 1.0000 | ENEMA | Freq: Once | RECTAL | Status: DC | PRN

## 2017-07-14 MED ORDER — SODIUM CHLORIDE 1 G PO TABS
2.0000 g | ORAL_TABLET | Freq: Three times a day (TID) | ORAL | Status: AC
  Administered 2017-07-15 – 2017-07-16 (×6): 2 g via ORAL
  Filled 2017-07-14 (×6): qty 2

## 2017-07-14 NOTE — ED Notes (Signed)
Date and time results received: 07/14/17 6:49 PM   Test: Troponin Critical Value: Trop: 0.03  Name of Provider Notified: Dr. Lamont Snowball

## 2017-07-14 NOTE — H&P (Signed)
[facility] [place] [facility]   PATIENT NAME: Elizabeth [name]    MR#:  [id number]  DATE OF BIRTH:  1929-10-04  DATE OF ADMISSION:  07/14/2017  PRIMARY CARE PHYSICIAN: [name], MD   REQUESTING/REFERRING PHYSICIAN:   CHIEF COMPLAINT:   Chief Complaint  Patient presents with  . Loss of Consciousness    HISTORY OF PRESENT ILLNESS: Elizabeth [name]  is a 84 y.o. female with a known history per below, was eating with her husband this evening when she suddenly slumped over and lost consciousness, patient was placed on the floor by husband who did CPR for 1 minute, patient return to neurologic baseline, patient did not recall the events of the evening, ER work-up noted for sodium 126, chloride 92, creatinine 2.3 with baseline 1.6, troponin 0.03, chest x-ray was a poor study, CT head negative for any acute process, EKG noted for PVCs/lateral T wave inversion/old anterior MI, patient evaluated at the bedside, multiple family members present, patient no apparent distress, resting comfortably in bed, patient is now been admitted for acute hyponatremia, acute kidney injury with chronic kidney disease stage III/IV, and acute syncope.  PAST MEDICAL HISTORY:   Past Medical History:  Diagnosis Date  . Diabetes mellitus without complication (HCC)   . Hyperlipidemia   . Hypertension   . Peripheral arterial disease (HCC)     PAST SURGICAL HISTORY:  Past Surgical History:  Procedure Laterality Date  . CARDIAC SURGERY     triple bypass  . FEMORAL-FEMORAL BYPASS GRAFT    . JOINT REPLACEMENT     right hip  . LEG SURGERY Left    leg bypass    SOCIAL HISTORY:  Social History   Tobacco Use  . Smoking status: Former Smoker    Types: Cigarettes  . Smokeless tobacco: Never Used  Substance Use Topics  . Alcohol use: No    FAMILY HISTORY:  Family History  Problem Relation Age of Onset  . Diabetes Mother     DRUG ALLERGIES:  Allergies  Allergen Reactions  .  Meperidine Nausea Only    REVIEW OF SYSTEMS: Poor historian due to retrograde amnesia  CONSTITUTIONAL: No fever, fatigue or weakness.  EYES: No blurred or double vision.  EARS, NOSE, AND THROAT: No tinnitus or ear pain.  RESPIRATORY: No cough, shortness of breath, wheezing or hemoptysis.  CARDIOVASCULAR: No chest pain, orthopnea, edema.  GASTROINTESTINAL: No nausea, vomiting, diarrhea or abdominal pain.  GENITOURINARY: No dysuria, hematuria.  ENDOCRINE: No polyuria, nocturia,  HEMATOLOGY: No anemia, easy bruising or bleeding SKIN: No rash or lesion. MUSCULOSKELETAL: No joint pain or arthritis.   NEUROLOGIC: No tingling, numbness, weakness.  PSYCHIATRY: No anxiety or depression.   MEDICATIONS AT HOME:  Prior to Admission medications   Medication Sig Start Date End Date Taking? Authorizing Provider  aspirin EC 81 MG tablet Take 81 mg by mouth daily.    Yes [provider]  Calcium Carb-Cholecalciferol (CALCIUM-VITAMIN D) 500-200 MG-UNIT tablet Take 1 tablet by mouth 2 (two) times daily.    Yes [provider]  colchicine 0.6 MG tablet Take 2 tablets (1.2mg ) by mouth at first sign of gout flare followed by 1 tablet (0.6mg ) after 1 hour. (Max 1.8mg  within 1 hour) 02/09/14  Yes [provider]  enalapril (VASOTEC) 20 MG tablet Take 20 mg by mouth daily.  03/27/15  Yes [provider]  ezetimibe (ZETIA) 10 MG tablet Take 10 mg by mouth daily. 07/14/17  Yes [provider]  glipiZIDE (GLUCOTROL) 5 MG tablet TAKE 1 TABLET BY MOUTH EVERY MORNING BEFORE BREAKFAST. 02/26/16  Yes [provider]  hydrochlorothiazide (MICROZIDE) 12.5 MG capsule TAKE 1 CAPSULE BY MOUTH ONCE DAILY. 10/16/15  Yes [provider]  labetalol (NORMODYNE) 100 MG tablet Take 100 mg by mouth daily.  03/22/15  Yes [provider]  metoprolol succinate (TOPROL-XL) 50 MG 24 hr tablet TAKE 1 TABLET BY MOUTH ONCE DAILY. 07/28/15  Yes [provider]  niacin  (NIASPAN) 500 MG CR tablet Take 500 mg by mouth at bedtime.  05/04/15 07/15/18 Yes [provider]      PHYSICAL EXAMINATION:   VITAL SIGNS: Blood pressure (!) 130/52, pulse (!) 59, temperature 98 F (36.7 C), temperature source Oral, height 5\' 4"  (1.626 m), weight 80.7 kg (178 lb), SpO2 100 %.  GENERAL:  84 y.o.-year-old patient lying in the bed with no acute distress.  EYES: Pupils equal, round, reactive to light and accommodation. No scleral icterus. Extraocular muscles intact.  HEENT: Head atraumatic, normocephalic. Oropharynx and nasopharynx clear.  NECK:  Supple, no jugular venous distention. No thyroid enlargement, no tenderness.  LUNGS: Normal breath sounds bilaterally, no wheezing, rales,rhonchi or crepitation. No use of accessory muscles of respiration.  CARDIOVASCULAR: S1, S2 normal. No murmurs, rubs, or gallops.  ABDOMEN: Soft, nontender, nondistended. Bowel sounds present. No organomegaly or mass.  EXTREMITIES: No pedal edema, cyanosis, or clubbing.  NEUROLOGIC: Cranial nerves II through XII are intact. MAES. Gait not checked.  PSYCHIATRIC: The patient is alert and oriented x 3.  SKIN: No obvious rash, lesion, or ulcer.   LABORATORY PANEL:   CBC Recent Labs  Lab 07/14/17 1802  WBC 6.7  HGB 11.4*  HCT 34.2*  PLT 182  MCV 77.1*  MCH 25.7*  MCHC 33.3  RDW 14.5  LYMPHSABS 1.8  MONOABS 0.9  EOSABS 0.1  BASOSABS 0.0   ------------------------------------------------------------------------------------------------------------------  Chemistries  Recent Labs  Lab 07/14/17 1802  NA 126*  K 4.3  CL 92*  CO2 22  GLUCOSE 97  BUN 43*  CREATININE 2.39*  CALCIUM 9.6  AST 23  ALT 21  ALKPHOS 55  BILITOT 0.9   ------------------------------------------------------------------------------------------------------------------ estimated creatinine clearance is 17.4 mL/min (A) (by C-G formula based on SCr of 2.39 mg/dL  (H)). ------------------------------------------------------------------------------------------------------------------ No results for input(s): TSH, T4TOTAL, T3FREE, THYROIDAB in the last 72 hours.  Invalid input(s): FREET3   Coagulation profile No results for input(s): INR, PROTIME in the last 168 hours. ------------------------------------------------------------------------------------------------------------------- No results for input(s): DDIMER in the last 72 hours. -------------------------------------------------------------------------------------------------------------------  Cardiac Enzymes Recent Labs  Lab 07/14/17 1802  TROPONINI 0.03*   ------------------------------------------------------------------------------------------------------------------ Invalid input(s): POCBNP  ---------------------------------------------------------------------------------------------------------------  Urinalysis    Component Value Date/Time   COLORURINE Yellow 07/15/2011 1406   APPEARANCEUR Hazy 07/15/2011 1406   LABSPEC 1.012 07/15/2011 1406   PHURINE 5.0 07/15/2011 1406   GLUCOSEU Negative 07/15/2011 1406   HGBUR Negative 07/15/2011 1406   BILIRUBINUR Negative 07/15/2011 1406   KETONESUR Negative 07/15/2011 1406   PROTEINUR Negative 07/15/2011 1406   NITRITE Negative 07/15/2011 1406   LEUKOCYTESUR 3+ 07/15/2011 1406     RADIOLOGY: Ct Head Wo Contrast  Result Date: 07/14/2017 CLINICAL DATA:  Altered mental status.  Status post CPR. EXAM: CT HEAD WITHOUT CONTRAST TECHNIQUE: Contiguous axial images were obtained from the base of the skull through the vertex without intravenous contrast. COMPARISON:  None. FINDINGS: Brain: Diffusely enlarged ventricles and subarachnoid spaces. Patchy white matter low density in both cerebral hemispheres. Old right occipital and left  posterior parietal infarcts. Small, old bilateral cerebellar hemisphere infarcts. No intracranial hemorrhage,  mass lesion or CT evidence of acute infarction. Vascular: No hyperdense vessel or unexpected calcification. Skull: Small right frontal bone exostosis/calcified cephalohematoma. Otherwise, unremarkable skull. Sinuses/Orbits: Unremarkable. Other: None. IMPRESSION: 1. No acute abnormality. 2. Moderate diffuse cerebral and cerebellar atrophy. 3. Moderate chronic small vessel white matter ischemic changes in both cerebral hemispheres. 4. Old infarcts, as described above. Electronically Signed   By: Beckie Salts M.D.   On: 07/14/2017 18:31   Dg Chest Port 1 View  Result Date: 07/14/2017 CLINICAL DATA:  Status post CPR. EXAM: PORTABLE CHEST 1 VIEW COMPARISON:  10/10/2009. FINDINGS: Poor inspiration. Minimal patchy opacity at both medial lung bases. Otherwise, clear lungs. Stable post CABG changes. Thoracic spine degenerative changes. Stable mildly enlarged cardiac silhouette. IMPRESSION: Poor inspiration with minimal patchy bibasilar atelectasis or aspiration pneumonitis. Electronically Signed   By: Beckie Salts M.D.   On: 07/14/2017 18:26    EKG: Orders placed or performed during the hospital encounter of 07/14/17  . ED EKG  . ED EKG  . EKG 12-Lead  . EKG 12-Lead    IMPRESSION AND PLAN: *Acute hyponatremia Most likely secondary to medication side effect Admit to regular nursing for bed, gentle IV fluids for rehydration, hold diuretic medication, salt tablets, BMP in the morning  *Acute kidney injury with chronic kidney disease stage III/IV  most likely secondary to diuretic side effect and poor p.o. Intake Avoid nephrotoxic agents, strict I&O monitoring, hold diuretics/ACE inhibitor, BMP in the morning, gentle IV fluids for rehydration  *Acute syncope with collapse Possibly secondary to above Check echocardiogram, carotid Dopplers, IV fluids for rehydration, rule out acute coronary syndrome with cardiac enzymes x3 sets, neurochecks per routine, fall precautions, physical therapy to  evaluate/treat, check orthostatics  *Chronic diabetes mellitus type 2 Stable Sliding scale insulin with Accu-Cheks per routine   All the records are reviewed and case discussed with ED provider. Management plans discussed with the patient, family and they are in agreement.  CODE STATUS:full    TOTAL TIME TAKING CARE OF THIS PATIENT: 45 minutes.    Evelena Asa Salary M.D on 07/14/2017   Between 7am to 6pm - Pager - [phone]  After 6pm go to [email or web address] - password [email or web address]  [facility]  Office  [phone]  CC: Primary care physician; [name], MD   Note: This dictation was prepared with Dragon dictation along with smaller phrase technology. Any transcriptional errors that result from this process are unintentional.

## 2017-07-14 NOTE — ED Triage Notes (Signed)
Pt ems from home for loss of consciousness. Per ems pt became unresponsive at dinner table and husband did approx 1 min of cpr before pt responded. Pt has no memory of event. Pt seems a/o now and states that she feels normal.

## 2017-07-14 NOTE — ED Provider Notes (Signed)
[facility] Emergency Department Provider Note  ____________________________________________   First MD Initiated Contact with Patient 07/14/17 1743     (approximate)  I have reviewed the triage vital signs and the nursing notes.   HISTORY  Chief Complaint Loss of Consciousness  Level 5 exemption history limited by the patient's clinical condition  HPI Elizabeth [name] is a 84 y.o. female who comes to the emergency department via EMS after a syncopal event while eating with her husband.  Apparently the patient was eating lunch and according to the husband she subsequently slumped over and became unresponsive.  He felt for a pulse and did not feel 1 and performed roughly 1 minute of CPR.  She subsequently awoke and has no memory of the event.  She is currently asymptomatic.  She has a complex past medical history including diabetes, dyslipidemia, hypertension, and a previous triple cardiac bypass.  She has never passed out before.  She denies headache double vision blurred vision numbness or weakness.  There is no seizure-like activity and she was not incontinent to urine or feces.  Past Medical History:  Diagnosis Date  . Diabetes mellitus without complication (HCC)   . Hyperlipidemia   . Hypertension   . Peripheral arterial disease [facility])     Patient Active Problem List   Diagnosis Date Noted  . AAA (abdominal aortic aneurysm) without rupture (HCC) 02/28/2016  . PAD (peripheral artery disease) (HCC) 02/28/2016  . Hyperlipidemia 02/28/2016  . 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 Secondary hypertension 02/28/2016    Past Surgical History:  Procedure Laterality Date  . CARDIAC SURGERY     triple bypass  . FEMORAL-FEMORAL BYPASS GRAFT    . JOINT REPLACEMENT     right hip  . LEG SURGERY Left    leg bypass    Prior to Admission medications   Medication Sig Start Date End Date Taking?  Authorizing Provider  aspirin EC 81 MG tablet Take 81 mg by mouth daily.    Yes [provider]  Calcium Carb-Cholecalciferol (CALCIUM-VITAMIN D) 500-200 MG-UNIT tablet Take 1 tablet by mouth 2 (two) times daily.    Yes [provider]  colchicine 0.6 MG tablet Take 2 tablets (1.2mg ) by mouth at first sign of gout flare followed by 1 tablet (0.6mg ) after 1 hour. (Max 1.8mg  within 1 hour) 02/09/14  Yes [provider]  enalapril (VASOTEC) 20 MG tablet Take 20 mg by mouth daily.  03/27/15  Yes [provider]  ezetimibe (ZETIA) 10 MG tablet Take 10 mg by mouth daily. 07/14/17  Yes [provider]  glipiZIDE (GLUCOTROL) 5 MG tablet TAKE 1 TABLET BY MOUTH EVERY MORNING BEFORE BREAKFAST. 02/26/16  Yes [provider]  hydrochlorothiazide (MICROZIDE) 12.5 MG capsule TAKE 1 CAPSULE BY MOUTH ONCE DAILY. 10/16/15  Yes [provider]  labetalol (NORMODYNE) 100 MG tablet Take 100 mg by mouth daily.  03/22/15  Yes [provider]  metoprolol succinate (TOPROL-XL) 50 MG 24 hr tablet TAKE 1 TABLET BY MOUTH ONCE DAILY. 07/28/15  Yes [provider]  niacin (NIASPAN) 500 MG CR tablet Take 500 mg by mouth at bedtime.  05/04/15 07/15/18 Yes [provider]    Allergies Meperidine  Family History  Problem Relation Age of Onset  . Diabetes Mother     Social History Social History   Tobacco Use  . Smoking status: Former Smoker    Types: Cigarettes  . Smokeless  tobacco: Never Used  Substance Use Topics  . Alcohol use: No  . Drug use: No    Review of Systems Level 5 exemption history limited by the patient's clinical condition  ____________________________________________   PHYSICAL EXAM:  VITAL SIGNS: ED Triage Vitals  Enc Vitals Group     BP      Pulse      Resp      Temp      Temp src      SpO2      Weight      Height      Head Circumference      Peak Flow      Pain Score      Pain Loc      Pain Edu?        Excl. in GC?     Constitutional: Alert and oriented x4 pleasant cooperative speaks in full clear sentences no diaphoresis Eyes: PERRL EOMI. Head: Atraumatic. Nose: No congestion/rhinnorhea. Mouth/Throat: No trismus no bites to her tongue Neck: No stridor.   Cardiovascular: Normal rate, regular rhythm. Grossly normal heart sounds.  Good peripheral circulation. Respiratory: Normal respiratory effort.  No retractions. Lungs CTAB and moving good air Gastrointestinal: Soft nontender Musculoskeletal: No lower extremity edema legs equal in size Neurologic:  Normal speech and language. No gross focal neurologic deficits are appreciated. Skin:  Skin is warm, dry and intact. No rash noted. Psychiatric: Mood and affect are normal. Speech and behavior are normal.    ____________________________________________   DIFFERENTIAL includes but not limited to  Cardiogenic syncope, vasovagal syncope, overt beta-blockade, ventricular tachycardia, seizure ____________________________________________   LABS (all labs ordered are listed, but only abnormal results are displayed)  Labs Reviewed  COMPREHENSIVE METABOLIC PANEL - Abnormal; Notable for the following components:      Result Value   Sodium 126 (*)    Chloride 92 (*)    BUN 43 (*)    Creatinine, Ser 2.39 (*)    GFR calc non Af Amer 17 (*)    GFR calc Af Amer 20 (*)    All other components within normal limits  TROPONIN I - Abnormal; Notable for the following components:   Troponin I 0.03 (*)    All other components within normal limits  CBC WITH DIFFERENTIAL/PLATELET - Abnormal; Notable for the following components:   Hemoglobin 11.4 (*)    HCT 34.2 (*)    MCV 77.1 (*)    MCH 25.7 (*)    All other components within normal limits  BRAIN NATRIURETIC PEPTIDE  URINALYSIS, COMPLETE (UACMP) WITH MICROSCOPIC    Lab work reviewed by me with slight acute kidney injury __________________________________________  EKG  ED ECG  REPORT I, Merrily Brittle, the attending physician, personally viewed and interpreted this ECG.  Date: 07/14/2017 EKG Time:  Rate: 69 Rhythm: normal sinus rhythm QRS Axis: normal Intervals: normal ST/T Wave abnormalities: normal Narrative Interpretation: Normal sinus rhythm with multiple premature ventricular contractions with at least 2 morphologies  ____________________________________________  RADIOLOGY  Chest x-ray reviewed by me with mild atelectasis Head CT reviewed by me with no acute disease ____________________________________________   PROCEDURES  Procedure(s) performed: no  .Critical Care Performed by: Merrily Brittle, MD Authorized by: Merrily Brittle, MD   Critical care provider statement:    Critical care time (minutes):  30   Critical care time was exclusive of:  Separately billable procedures and treating other patients   Critical care was necessary to treat or prevent imminent or life-threatening deterioration of  the following conditions:  Cardiac failure   Critical care was time spent personally by me on the following activities:  Development of treatment plan with patient or surrogate, discussions with consultants, evaluation of patient's response to treatment, examination of patient, obtaining history from patient or surrogate, ordering and performing treatments and interventions, ordering and review of laboratory studies, ordering and review of radiographic studies, pulse oximetry, re-evaluation of patient's condition and review of old charts    Critical Care performed: Yes  Observation: no ____________________________________________   INITIAL IMPRESSION / ASSESSMENT AND PLAN / ED COURSE  Pertinent labs & imaging results that were available during my care of the patient were reviewed by me and considered in my medical decision making (see chart for details).  The patient arrives asymptomatic and well-appearing however she gives a very concerning  story for cardiogenic syncope.  She has no crepitus across her chest.  No obvious evidence of seizure however given the severity of the event head CT chest x-ray and broad labs are pending.  First EKG with multiple PVCs with multiple morphologies.     ----------------------------------------- 6:44 PM on 07/14/2017 -----------------------------------------  The patient just had a run of ectopy on the monitor of 8 beats with at least 6 different morphologies.  At this point it is unclear if the patient has over beta-blockade from both metoprolol and labetalol and acute kidney injury versus a ventricular dysrhythmia.  She requires inpatient admission for at least 24 hours of telemetry.  I discussed with the patient and family who verbalized understanding and agreement with plan.  I then discussed with the hospitalist Dr. Katheren Shams who has graciously agreed to admit the patient to his service. ____________________________________________   FINAL CLINICAL IMPRESSION(S) / ED DIAGNOSES  Final diagnoses:  Syncope, cardiogenic      NEW MEDICATIONS STARTED DURING THIS VISIT:  New Prescriptions   No medications on file     Note:  This document was prepared using Dragon voice recognition software and may include unintentional dictation errors.     Merrily Brittle, MD 07/14/17 2033

## 2017-07-14 NOTE — Progress Notes (Signed)
Family Meeting Note  Advance Directive:yes  Today a meeting took place with the Patient, spouse, daughter.  Patient is able to participate  The following clinical team members were present during this meeting:MD  The following were discussed:Patient's diagnosis: Diabetes, hyperlipidemia, hypertension, peripheral artery disease, coronary artery disease, history of strokes, chronic kidney disease, syncope, hyponatremia, acute kidney injury, Patient's progosis: Unable to determine and Goals for treatment: Full Code  Additional follow-up to be provided: prn  Time spent during discussion:20 minutes  Bertrum Sol, MD

## 2017-07-14 NOTE — ED Notes (Signed)
Pt assisted to bathroom, able to ambulate with assistance.

## 2017-07-15 ENCOUNTER — Inpatient Hospital Stay
Admit: 2017-07-15 | Discharge: 2017-07-15 | Disposition: A | Discharge status: Home / Self Care | Payer: Medicare Other | Attending: Family Medicine | Admitting: Family Medicine

## 2017-07-15 ENCOUNTER — Other Ambulatory Visit: Payer: Self-pay

## 2017-07-15 ENCOUNTER — Inpatient Hospital Stay: Payer: Medicare Other

## 2017-07-15 DIAGNOSIS — L8990 Pressure ulcer of unspecified site, unspecified stage: Secondary | ICD-10-CM

## 2017-07-15 LAB — BASIC METABOLIC PANEL
Anion gap: 11 (ref 5–15)
BUN: 43 mg/dL — AB (ref 6–20)
CHLORIDE: 94 mmol/L — AB (ref 101–111)
CO2: 21 mmol/L — ABNORMAL LOW (ref 22–32)
CREATININE: 2.37 mg/dL — AB (ref 0.44–1.00)
Calcium: 9.5 mg/dL (ref 8.9–10.3)
GFR calc Af Amer: 20 mL/min — ABNORMAL LOW (ref >60)
GFR, EST NON AFRICAN AMERICAN: 17 mL/min — AB (ref >60)
Glucose, Bld: 99 mg/dL (ref 65–99)
POTASSIUM: 4.2 mmol/L (ref 3.5–5.1)
Sodium: 126 mmol/L — ABNORMAL LOW (ref 135–145)

## 2017-07-15 LAB — TROPONIN I
TROPONIN I: 0.03 ng/mL — AB (ref <0.03)
Troponin I: 0.03 ng/mL (ref <0.03)
Troponin I: 0.03 ng/mL (ref <0.03)

## 2017-07-15 LAB — URINALYSIS, COMPLETE (UACMP) WITH MICROSCOPIC
BILIRUBIN URINE: NEGATIVE
Bacteria, UA: NONE SEEN
Glucose, UA: NEGATIVE mg/dL
Hgb urine dipstick: NEGATIVE
Ketones, ur: NEGATIVE mg/dL
NITRITE: NEGATIVE
PH: 6 (ref 5.0–8.0)
Protein, ur: NEGATIVE mg/dL
SPECIFIC GRAVITY, URINE: 1.008 (ref 1.005–1.030)

## 2017-07-15 LAB — HEMOGLOBIN A1C
Hgb A1c MFr Bld: 6.4 % — ABNORMAL HIGH (ref 4.8–5.6)
MEAN PLASMA GLUCOSE: 136.98 mg/dL

## 2017-07-15 LAB — GLUCOSE, CAPILLARY
GLUCOSE-CAPILLARY: 79 mg/dL (ref 65–99)
GLUCOSE-CAPILLARY: 88 mg/dL (ref 65–99)
GLUCOSE-CAPILLARY: 109 mg/dL — AB (ref 65–99)
Glucose-Capillary: 123 mg/dL — ABNORMAL HIGH (ref 65–99)

## 2017-07-15 LAB — SODIUM, URINE, RANDOM: SODIUM UR: 55 mmol/L

## 2017-07-15 MED ORDER — METOPROLOL SUCCINATE ER 50 MG PO TB24
50.0000 mg | ORAL_TABLET | Freq: Every day | ORAL | Status: DC
  Administered 2017-07-16: 50 mg via ORAL
  Filled 2017-07-15: qty 1

## 2017-07-15 MED ORDER — COLCHICINE 0.6 MG PO TABS: 0.6000 mg | ORAL_TABLET | Freq: Every day | ORAL | Status: DC | PRN

## 2017-07-15 NOTE — Progress Notes (Signed)
PT Cancellation Note  Patient Details Name: CASSIE ILG MRN: [id number] DOB: Nov 09, 1929   Cancelled Treatment:    Reason Eval/Treat Not Completed: Patient at procedure or test/unavailable.  Pt with imaging.  Will re-attempt if time allows.   Glenetta Hew, PT, DPT 07/15/2017, 11:49 AM

## 2017-07-15 NOTE — Progress Notes (Signed)
PT Cancellation Note  Patient Details Name: DASIYA HELDER MRN: [id number] DOB: 1929/09/21   Cancelled Treatment:    Reason Eval/Treat Not Completed: Patient at procedure or test/unavailable. Order received.  Chart reviewed.  Pt prepping for imaging and [place] present when PT arrived.  Will re-attempt at a later time.   Glenetta Hew, PT, DPT 07/15/2017, 9:32 AM

## 2017-07-15 NOTE — Progress Notes (Signed)
*  PRELIMINARY RESULTS* Echocardiogram 2D Echocardiogram has been performed.  Elizabeth [name] 07/15/2017, 12:11 PM

## 2017-07-15 NOTE — Progress Notes (Signed)
Patient ID: Elizabeth [name], female   DOB: 1929/07/04, 84 y.o.   MRN: [id number]  [employer] PROGRESS NOTE  ZANDRA LUY NWG:[id number] DOB: 1929/05/20 DOA: 07/14/2017 PCP: [name], MD  HPI/Subjective: Patient feeling okay and wants to go home.  Patient states that she had a gout attack and was taking colchicine with each meal.  She then started having diarrhea.  She stopped taking the colchicine.    the diarrhea settled down.  Husband yesterday had to do CPR because she had a syncopal episode and did not have a pulse and was not breathing.  When EMS arrived she was already responding and talking.  No loss of bowel or bladder function with this episode.  Objective: Vitals:   07/15/17 0718 07/15/17 0908  BP: (!) 154/67   Pulse: (!) 59 64  Resp: 18   Temp: 97.6 F (36.4 C)   SpO2: 100%     Filed Weights   07/14/17 1758 07/14/17 2212 07/15/17 0338  Weight: 80.7 kg (178 lb) 73.3 kg (161 lb 9.6 oz) 73.3 kg (161 lb 9.6 oz)    ROS: Review of Systems  Constitutional: Negative for chills and fever.  Eyes: Negative for blurred vision.  Respiratory: Negative for cough and shortness of breath.   Cardiovascular: Negative for chest pain.  Gastrointestinal: Negative for abdominal pain, constipation, diarrhea, nausea and vomiting.  Genitourinary: Negative for dysuria.  Musculoskeletal: Negative for joint pain.  Neurological: Negative for dizziness and headaches.   Exam: Physical Exam  Constitutional: She is oriented to person, place, and time.  HENT:  Nose: No mucosal edema.  Mouth/Throat: No oropharyngeal exudate or posterior oropharyngeal edema.  Eyes: Pupils are equal, round, and reactive to light. Conjunctivae, EOM and lids are normal.  Neck: No JVD present. Carotid bruit is not present. No edema present. No thyroid mass and no thyromegaly present.  Cardiovascular: S1 normal and S2 normal. Exam reveals no gallop.  No murmur heard. Pulses:      Dorsalis pedis pulses are  2+ on the right side, and 2+ on the left side.  Respiratory: No respiratory distress. She has no wheezes. She has no rhonchi. She has no rales.  GI: Soft. Bowel sounds are normal. There is no tenderness.  Musculoskeletal:       Right ankle: She exhibits no swelling.       Left ankle: She exhibits no swelling.  Lymphadenopathy:    She has no cervical adenopathy.  Neurological: She is alert and oriented to person, place, and time. No cranial nerve deficit.  Skin: Skin is warm. No rash noted. Nails show no clubbing.  Psychiatric: She has a normal mood and affect.      Data Reviewed: Basic Metabolic Panel: Recent Labs  Lab 07/14/17 1802 07/14/17 2259 07/15/17 0350  NA 126*  --  126*  K 4.3  --  4.2  CL 92*  --  94*  CO2 22  --  21*  GLUCOSE 97  --  99  BUN 43*  --  43*  CREATININE 2.39* 2.52* 2.37*  CALCIUM 9.6  --  9.5   Liver Function Tests: Recent Labs  Lab 07/14/17 1802  AST 23  ALT 21  ALKPHOS 55  BILITOT 0.9  PROT 6.8  ALBUMIN 4.1   CBC: Recent Labs  Lab 07/14/17 1802 07/14/17 2259  WBC 6.7 5.9  NEUTROABS 3.9  --   HGB 11.4* 11.9*  HCT 34.2* 35.8  MCV 77.1* 76.5*  PLT 182 169  Cardiac Enzymes: Recent Labs  Lab 07/14/17 1802 07/14/17 2259 07/15/17 0350 07/15/17 0806  TROPONINI 0.03* 0.03* 0.03* 0.03*    CBG: Recent Labs  Lab 07/14/17 2341 07/15/17 0721 07/15/17 1124 07/15/17 1615  GLUCAP 146* 79 123* 88       Studies: Ct Head Wo Contrast  Result Date: 07/14/2017 CLINICAL DATA:  Altered mental status.  Status post CPR. EXAM: CT HEAD WITHOUT CONTRAST TECHNIQUE: Contiguous axial images were obtained from the base of the skull through the vertex without intravenous contrast. COMPARISON:  None. FINDINGS: Brain: Diffusely enlarged ventricles and subarachnoid spaces. Patchy white matter low density in both cerebral hemispheres. Old right occipital and left posterior parietal infarcts. Small, old bilateral cerebellar hemisphere infarcts. No  intracranial hemorrhage, mass lesion or CT evidence of acute infarction. Vascular: No hyperdense vessel or unexpected calcification. Skull: Small right frontal bone exostosis/calcified cephalohematoma. Otherwise, unremarkable skull. Sinuses/Orbits: Unremarkable. Other: None. IMPRESSION: 1. No acute abnormality. 2. Moderate diffuse cerebral and cerebellar atrophy. 3. Moderate chronic small vessel white matter ischemic changes in both cerebral hemispheres. 4. Old infarcts, as described above. Electronically Signed   By: Beckie Salts M.D.   On: 07/14/2017 18:31   US Carotid Bilateral  Result Date: 07/15/2017 CLINICAL DATA:  Syncope. Hypertension, coronary artery disease, hyperlipidemia, diabetes. EXAM: BILATERAL CAROTID DUPLEX ULTRASOUND TECHNIQUE: Wallace Cullens scale imaging, color Doppler and duplex ultrasound was performed of bilateral carotid and vertebral arteries in the neck. COMPARISON:  None. TECHNIQUE: Quantification of carotid stenosis is based on velocity parameters that correlate the residual internal carotid diameter with NASCET-based stenosis levels, using the diameter of the distal internal carotid lumen as the denominator for stenosis measurement. The following velocity measurements were obtained: PEAK SYSTOLIC/END DIASTOLIC RIGHT ICA:                     64/8cm/sec CCA:                     62/5cm/sec SYSTOLIC ICA/CCA RATIO:  0.9 ECA:                     110cm/sec LEFT ICA:                     81/12cm/sec CCA:                     54/6cm/sec SYSTOLIC ICA/CCA RATIO:  1 ECA:                     160cm/sec FINDINGS: RIGHT CAROTID ARTERY: Mild irregular plaque in the distal common carotid artery and bulb, proximal internal and external carotid arteries. No high-grade stenosis. Normal waveforms and color Doppler signal. RIGHT VERTEBRAL ARTERY:  Normal flow direction and waveform. LEFT CAROTID ARTERY: Mild irregular plaque through the length of the common carotid artery, in the bulb and proximal ICA. No high-grade  stenosis. Normal waveforms and color Doppler signal. LEFT VERTEBRAL ARTERY: Normal flow direction and waveform. IMPRESSION: 1. Extensive nonocclusive bilateral carotid plaque resulting in less than 50% diameter stenosis. 2.  Antegrade bilateral vertebral arterial flow. Electronically Signed   By: Corlis Leak M.D.   On: 07/15/2017 10:51   Dg Chest Port 1 View  Result Date: 07/14/2017 CLINICAL DATA:  Status post CPR. EXAM: PORTABLE CHEST 1 VIEW COMPARISON:  10/10/2009. FINDINGS: Poor inspiration. Minimal patchy opacity at both medial lung bases. Otherwise, clear lungs. Stable post CABG changes. Thoracic spine degenerative changes. Stable mildly enlarged  cardiac silhouette. IMPRESSION: Poor inspiration with minimal patchy bibasilar atelectasis or aspiration pneumonitis. Electronically Signed   By: Beckie Salts M.D.   On: 07/14/2017 18:26    Scheduled Meds: . aspirin EC  81 mg Oral Daily  . calcium-vitamin D  1 tablet Oral BID  . ezetimibe  10 mg Oral Daily  . heparin  5,000 Units Subcutaneous Q8H  . insulin aspart  0-5 Units Subcutaneous QHS  . insulin aspart  0-9 Units Subcutaneous TID WC  . [START ON 07/16/2017] metoprolol succinate  50 mg Oral Daily  . niacin  500 mg Oral QHS  . sodium chloride  2 g Oral TID WC   Continuous Infusions: . sodium chloride 40 mL/hr at 07/15/17 1400    Assessment/Plan:  1. Syncope as outpatient with CPR being done.  Case discussed with Dr. Juliann Pares cardiology.  Echocardiogram still pending.  Cardiac enzymes negative.  Carotid ultrasound negative.  Monitor today on telemetry.  2. Acute kidney injury versus chronic kidney disease.  I do not have a baseline creatinine and her creatinine is pretty impaired here.  Continue to monitor with IV fluid hydration.  Likely will need nephrology as outpatient. 3. Hyponatremia could be secondary to dehydration and hydrochlorothiazide.  Hydrochlorothiazide stopped and gentle IV fluids.  Monitor sodium tomorrow morning.  Urine  sodium and urine osmolarity ordered 4. Type 2 diabetes mellitus.  Stop glipizide and monitor on sliding scale.  With the patient's creatinine glipizide is not a great medication. 5. History of gout.  Can consider starting low-dose allopurinol.  With the colchicine I would only take 1 pill/day only if she has a acute gout attack. 6. Hyperlipidemia on Zetia and niacin 7. Hypertension on metoprolol  Code Status:     Code Status Orders  (From admission, onward)        Start     Ordered   07/14/17 2204  Full code  Continuous     07/14/17 2203    Code Status History    This patient has a current code status but no historical code status.     Family Communication: Family at the bedside Disposition Plan: Potential discharge tomorrow if sodium is better  Consultants:  Dr. Juliann Pares cardiology  Time spent: 30 minutes  Montoya Brandel [employer]

## 2017-07-15 NOTE — Evaluation (Signed)
Physical Therapy Evaluation Patient Details Name: Elizabeth [name] MRN: [id number] DOB: 1929/07/04 Today's Date: 07/15/2017   History of Present Illness  Patient is an 84 year old female admitted from home for hyponatremia following an episode of syncope and collapse.  PMH includes PAD, HLD, Htn and DM.  Clinical Impression  Patient is an 84 year old female who lives in a multi story home with her husband.  She is independent with intermittent use of SPC at baseline.  Pt in bed and reports no pain upon PT arrival.  She was able to perform bed mobility mod I and sit at EOB independently.  Pt presented with fair to good strength of bilateral UE/LE and reports no sensation loss.  She was able to perform standing pivot and STS transfers with RW and CGA.  Pt ambulated 75 ft with RW and education concerning proper use of RW during turns and navigation of obstacles.  PT provided education concerning proper body mechanics for energy conservation and safety during transfers.  Pt will benefit from continued PT with focus on strength, safe use of RW, function mobility and Hep for improved strength.    Follow Up Recommendations Home health PT    Equipment Recommendations  None recommended by PT    Recommendations for Other Services       Precautions / Restrictions Precautions Precautions: Fall Restrictions Weight Bearing Restrictions: No      Mobility  Bed Mobility Overal bed mobility: Modified Independent             General bed mobility comments: Increased time and HOB elevated.  Transfers Overall transfer level: Needs assistance Equipment used: Rolling walker (2 wheeled) Transfers: Sit to/from [employer] Sit to Stand: Min guard Stand pivot transfers: Min guard       General transfer comment: Able to initiate STS without physical assist and stand on first attempt.  PT offered VC's for hand placement and safe use of RW.  Ambulation/Gait Ambulation/Gait assistance:  Min guard Ambulation Distance (Feet): 75 Feet Assistive device: Rolling walker (2 wheeled)     Gait velocity interpretation: 1.31 - 2.62 ft/sec, indicative of limited community ambulator General Gait Details: Moderate foot clearance, decreased step length and education for proper use of RW.  Pt did not report fatigue following ambulation.  Stairs            Wheelchair Mobility    Modified Rankin (Stroke Patients Only)       Balance Overall balance assessment: Modified Independent                                           Pertinent Vitals/Pain Pain Assessment: No/denies pain    Home Living Family/patient expects to be discharged to:: Private residence Living Arrangements: Spouse/significant other Available Help at Discharge: Family;Available 24 hours/day Type of Home: House Home Access: Stairs to enter Entrance Stairs-Rails: Can reach both Entrance Stairs-Number of Steps: 2 Home Layout: Two level Home Equipment: Walker - 2 wheels;Cane - single point      Prior Function Level of Independence: Independent with assistive device(s)         Comments: Pt uses SPC as needed.     Hand Dominance        Extremity/Trunk Assessment   Upper Extremity Assessment Upper Extremity Assessment: Overall WFL for tasks assessed(Grossly 4-/5 bilaterally.)    Lower Extremity Assessment Lower Extremity Assessment:  Overall [facility] for tasks assessed(Grossly 4/5 bilaterally.  No sensation loss noted.)    Cervical / Trunk Assessment Cervical / Trunk Assessment: Normal  Communication   Communication: No difficulties  Cognition Arousal/Alertness: Awake/alert Behavior During Therapy: WFL for tasks assessed/performed Overall Cognitive Status: Within Functional Limits for tasks assessed                                 General Comments: Alert and oriented x4.  Follows commands consistently.      General Comments      Exercises Other  Exercises Other Exercises: Education concerning safe use of AD and body mechanics for safe transfers.   Assessment/Plan    PT Assessment Patient needs continued PT services  PT Problem List Decreased strength;Decreased mobility;Decreased knowledge of use of DME       PT Treatment Interventions DME instruction;Therapeutic activities;Gait training;Therapeutic exercise;Stair training;Balance training;Functional mobility training;Neuromuscular re-education;Patient/family education    PT Goals (Current goals can be found in the Care Plan section)  Acute Rehab PT Goals Patient Stated Goal: To return home and back to normal level of function. PT Goal Formulation: With patient Time For Goal Achievement: 07/29/17 Potential to Achieve Goals: Good    Frequency Min 2X/week   Barriers to discharge        Co-evaluation               AM-PAC PT "6 Clicks" Daily Activity  Outcome Measure Difficulty turning over in bed (including adjusting bedclothes, sheets and blankets)?: None Difficulty moving from lying on back to sitting on the side of the bed? : A Little Difficulty sitting down on and standing up from a chair with arms (e.g., wheelchair, bedside commode, etc,.)?: A Little Help needed moving to and from a bed to chair (including a wheelchair)?: A Little Help needed walking in hospital room?: A Little Help needed climbing 3-5 steps with a railing? : A Little 6 Click Score: 19    End of Session Equipment Utilized During Treatment: Gait belt Activity Tolerance: Patient tolerated treatment well Patient left: in chair;with chair alarm set;with call bell/phone within reach Nurse Communication: Mobility status PT Visit Diagnosis: Muscle weakness (generalized) (M62.81)    Time: 1430-1500 PT Time Calculation (min) (ACUTE ONLY): 30 min   Charges:   PT Evaluation $PT Eval Low Complexity: 1 Low PT Treatments $Therapeutic Activity: 8-22 mins   PT G Codes:   PT G-Codes **NOT FOR  INPATIENT CLASS** Functional Assessment Tool Used: AM-PAC 6 Clicks Basic Mobility    Glenetta Hew, PT, DPT   Glenetta Hew 07/15/2017, 3:25 PM

## 2017-07-16 ENCOUNTER — Encounter: Payer: Self-pay | Admitting: *Deleted

## 2017-07-16 LAB — TSH: TSH: 0.744 u[IU]/mL (ref 0.350–4.500)

## 2017-07-16 LAB — BASIC METABOLIC PANEL
ANION GAP: 10 (ref 5–15)
BUN: 38 mg/dL — ABNORMAL HIGH (ref 6–20)
CO2: 19 mmol/L — ABNORMAL LOW (ref 22–32)
Calcium: 9.3 mg/dL (ref 8.9–10.3)
Chloride: 102 mmol/L (ref 101–111)
Creatinine, Ser: 2.17 mg/dL — ABNORMAL HIGH (ref 0.44–1.00)
GFR calc Af Amer: 23 mL/min — ABNORMAL LOW (ref >60)
GFR, EST NON AFRICAN AMERICAN: 19 mL/min — AB (ref >60)
GLUCOSE: 79 mg/dL (ref 65–99)
Potassium: 5.2 mmol/L — ABNORMAL HIGH (ref 3.5–5.1)
Sodium: 131 mmol/L — ABNORMAL LOW (ref 135–145)

## 2017-07-16 LAB — GLUCOSE, CAPILLARY
GLUCOSE-CAPILLARY: 68 mg/dL (ref 65–99)
GLUCOSE-CAPILLARY: 189 mg/dL — AB (ref 65–99)

## 2017-07-16 LAB — OSMOLALITY, URINE: Osmolality, Ur: 306 mOsm/kg (ref 300–900)

## 2017-07-16 MED ORDER — SODIUM CHLORIDE 1 G PO TABS: 1.0000 g | ORAL_TABLET | Freq: Three times a day (TID) | ORAL | 0 refills | Status: DC

## 2017-07-16 MED ORDER — PATIROMER SORBITEX CALCIUM 8.4 G PO PACK
8.4000 g | PACK | Freq: Once | ORAL | Status: AC
Start: 2017-07-16 — End: 2017-07-16
  Administered 2017-07-16: 8.4 g via ORAL
  Filled 2017-07-16: qty 1

## 2017-07-16 MED ORDER — COLCHICINE 0.6 MG PO TABS: 0.6000 mg | ORAL_TABLET | Freq: Every day | ORAL | 0 refills | Status: DC | PRN

## 2017-07-16 MED ORDER — SITAGLIPTIN PHOSPHATE 25 MG PO TABS: 25.0000 mg | ORAL_TABLET | Freq: Every day | ORAL | 0 refills | Status: DC

## 2017-07-16 NOTE — Plan of Care (Signed)
  Problem: Education: Goal: Knowledge of General Education information will improve Outcome: Progressing   Problem: Health Behavior/Discharge Planning: Goal: Ability to manage health-related needs will improve Outcome: Progressing   Problem: Clinical Measurements: Goal: Ability to maintain clinical measurements within normal limits will improve Outcome: Progressing Goal: Will remain free from infection Outcome: Progressing Goal: Respiratory complications will improve Outcome: Progressing Goal: Cardiovascular complication will be avoided Outcome: Progressing   Problem: Activity: Goal: Risk for activity intolerance will decrease Outcome: Progressing   Problem: Nutrition: Goal: Adequate nutrition will be maintained Outcome: Progressing   Problem: Coping: Goal: Level of anxiety will decrease Outcome: Progressing   Problem: Elimination: Goal: Will not experience complications related to bowel motility Outcome: Progressing Goal: Will not experience complications related to urinary retention Outcome: Progressing   Problem: Pain Managment: Goal: General experience of comfort will improve Outcome: Progressing   Problem: Safety: Goal: Ability to remain free from injury will improve Outcome: Progressing   Problem: Skin Integrity: Goal: Risk for impaired skin integrity will decrease Outcome: Progressing

## 2017-07-16 NOTE — Care Management (Signed)
Patient for discharge home today with home health nurse and physical therapy. No agency preference.  Referral to [facility] and accepted.  Confirmed patient's contact information.  Family to transport patient home

## 2017-07-16 NOTE — Discharge Summary (Signed)
[facility] - [place] at [facility]   PATIENT NAME: Elizabeth [name]    MR#:  [id number]  DATE OF BIRTH:  01/02/1930  DATE OF ADMISSION:  07/14/2017 ADMITTING PHYSICIAN: [name], MD  DATE OF DISCHARGE: No discharge date for patient encounter.  PRIMARY CARE PHYSICIAN: [name], MD    ADMISSION DIAGNOSIS:  Syncope, cardiogenic [R55] Hyponatremia syndrome [E87.1]  DISCHARGE DIAGNOSIS:  Active Problems:   Hyponatremia syndrome   Pressure injury of skin   SECONDARY DIAGNOSIS:   Past Medical History:  Diagnosis Date  . Diabetes mellitus without complication (HCC)   . Hyperlipidemia   . Hypertension   . Peripheral arterial disease [facility])     HOSPITAL COURSE:  *Acute syncope Resolved Most likely secondary to severe hyponatremia, dehydration, acute kidney injury with chronic kidney disease  *Acute severe hyponatremia Most likely secondary to dehydration and medication side effect Hydrochlorothiazide was discontinued, patient received IV fluids for rehydration, salt tabs, patient did well, sodium 131 at discharge  *AKI w/ CKD Improved with IV fluids for rehydration, diuretics were discontinued, lisinopril held while in house, creatinine 2.1 at discharge   *Chronic diabetes mellitus type 2  Stopped glipizide, given Cr - glipizide is not a great medication, started on Januvia, patient follow-up with primary care provider for reevaluation in 2 to 5 days  *History of gout Stable Continue allopurinol at lower dose Colchicine as needed gouty attack  *Hyperlipidemia Stable on Zetia and niacin  *Hypertension Stable on metoprolol   DISCHARGE CONDITIONS:  Stable  CONSULTS OBTAINED:  Treatment Team:  [name], MD [name], Evelena Asa, MD  DRUG ALLERGIES:   Allergies  Allergen Reactions  . Meperidine Nausea Only    DISCHARGE MEDICATIONS:   Allergies as of 07/16/2017      Reactions   Meperidine Nausea Only       Medication List    STOP taking these medications   enalapril 20 MG tablet Commonly known as:  VASOTEC   hydrochlorothiazide 12.5 MG capsule Commonly known as:  MICROZIDE   labetalol 100 MG tablet Commonly known as:  NORMODYNE     TAKE these medications   aspirin EC 81 MG tablet Take 81 mg by mouth daily.   calcium-vitamin D 500-200 MG-UNIT tablet Take 1 tablet by mouth 2 (two) times daily.   colchicine 0.6 MG tablet Take 2 tablets (1.2mg ) by mouth at first sign of gout flare followed by 1 tablet (0.6mg ) after 1 hour. (Max 1.8mg  within 1 hour)   ezetimibe 10 MG tablet Commonly known as:  ZETIA Take 10 mg by mouth daily.   glipiZIDE 5 MG tablet Commonly known as:  GLUCOTROL TAKE 1 TABLET BY MOUTH EVERY MORNING BEFORE BREAKFAST.   metoprolol succinate 50 MG 24 hr tablet Commonly known as:  TOPROL-XL TAKE 1 TABLET BY MOUTH ONCE DAILY.   niacin 500 MG CR tablet Commonly known as:  NIASPAN Take 500 mg by mouth at bedtime.   sodium chloride 1 g tablet Take 1 tablet (1 g total) by mouth 3 (three) times daily with meals.        DISCHARGE INSTRUCTIONS:   If you experience worsening of your admission symptoms, develop shortness of breath, life threatening emergency, suicidal or homicidal thoughts you must seek medical attention immediately by calling 911 or calling your MD immediately  if symptoms less severe.  You Must read complete instructions/literature along with all the possible adverse reactions/side effects for all the Medicines you take and that have  been prescribed to you. Take any new Medicines after you have completely understood and accept all the possible adverse reactions/side effects.   Please note  You were cared for by a hospitalist during your hospital stay. If you have any questions about your discharge medications or the care you received while you were in the hospital after you are discharged, you can call the unit and asked to speak with the  hospitalist on call if the hospitalist that took care of you is not available. Once you are discharged, your primary care physician will handle any further medical issues. Please note that NO REFILLS for any discharge medications will be authorized once you are discharged, as it is imperative that you return to your primary care physician (or establish a relationship with a primary care physician if you do not have one) for your aftercare needs so that they can reassess your need for medications and monitor your lab values.    Today   CHIEF COMPLAINT:   Chief Complaint  Patient presents with  . Loss of Consciousness    HISTORY OF PRESENT ILLNESS:  84 y.o. female with a known history per below, was eating with her husband this evening when she suddenly slumped over and lost consciousness, patient was placed on the floor by husband who did CPR for 1 minute, patient return to neurologic baseline, patient did not recall the events of the evening, ER work-up noted for sodium 126, chloride 92, creatinine 2.3 with baseline 1.6, troponin 0.03, chest x-ray was a poor study, CT head negative for any acute process, EKG noted for PVCs/lateral T wave inversion/old anterior MI, patient evaluated at the bedside, multiple family members present, patient no apparent distress, resting comfortably in bed, patient is now been admitted for acute hyponatremia, acute kidney injury with chronic kidney disease stage III/IV, and acute syncope.  VITAL SIGNS:  Blood pressure (!) 128/50, pulse 62, temperature 98.5 F (36.9 C), resp. rate 18, height 5\' 4"  (1.626 m), weight 71.3 kg (157 lb 3.2 oz), SpO2 92 %.  I/O:    Intake/Output Summary (Last 24 hours) at 07/16/2017 1025 Last data filed at 07/16/2017 0952 Gross per 24 hour  Intake 1967.67 ml  Output 1400 ml  Net 567.67 ml    PHYSICAL EXAMINATION:  GENERAL:  84 y.o.-year-old patient lying in the bed with no acute distress.  EYES: Pupils equal, round, reactive to  light and accommodation. No scleral icterus. Extraocular muscles intact.  HEENT: Head atraumatic, normocephalic. Oropharynx and nasopharynx clear.  NECK:  Supple, no jugular venous distention. No thyroid enlargement, no tenderness.  LUNGS: Normal breath sounds bilaterally, no wheezing, rales,rhonchi or crepitation. No use of accessory muscles of respiration.  CARDIOVASCULAR: S1, S2 normal. No murmurs, rubs, or gallops.  ABDOMEN: Soft, non-tender, non-distended. Bowel sounds present. No organomegaly or mass.  EXTREMITIES: No pedal edema, cyanosis, or clubbing.  NEUROLOGIC: Cranial nerves II through XII are intact. Muscle strength 5/5 in all extremities. Sensation intact. Gait not checked.  PSYCHIATRIC: The patient is alert and oriented x 3.  SKIN: No obvious rash, lesion, or ulcer.   DATA REVIEW:   CBC Recent Labs  Lab 07/14/17 2259  WBC 5.9  HGB 11.9*  HCT 35.8  PLT 169    Chemistries  Recent Labs  Lab 07/14/17 1802  07/16/17 0639  NA 126*   < > 131*  K 4.3   < > 5.2*  CL 92*   < > 102  CO2 22   < > 19*  GLUCOSE 97   < > 79  BUN 43*   < > 38*  CREATININE 2.39*   < > 2.17*  CALCIUM 9.6   < > 9.3  AST 23  --   --   ALT 21  --   --   ALKPHOS 55  --   --   BILITOT 0.9  --   --    < > = values in this interval not displayed.    Cardiac Enzymes Recent Labs  Lab 07/15/17 0806  TROPONINI 0.03*    Microbiology Results  No results found for this or any previous visit.  RADIOLOGY:  Ct Head Wo Contrast  Result Date: 07/14/2017 CLINICAL DATA:  Altered mental status.  Status post CPR. EXAM: CT HEAD WITHOUT CONTRAST TECHNIQUE: Contiguous axial images were obtained from the base of the skull through the vertex without intravenous contrast. COMPARISON:  None. FINDINGS: Brain: Diffusely enlarged ventricles and subarachnoid spaces. Patchy white matter low density in both cerebral hemispheres. Old right occipital and left posterior parietal infarcts. Small, old bilateral  cerebellar hemisphere infarcts. No intracranial hemorrhage, mass lesion or CT evidence of acute infarction. Vascular: No hyperdense vessel or unexpected calcification. Skull: Small right frontal bone exostosis/calcified cephalohematoma. Otherwise, unremarkable skull. Sinuses/Orbits: Unremarkable. Other: None. IMPRESSION: 1. No acute abnormality. 2. Moderate diffuse cerebral and cerebellar atrophy. 3. Moderate chronic small vessel white matter ischemic changes in both cerebral hemispheres. 4. Old infarcts, as described above. Electronically Signed   By: Beckie Salts M.D.   On: 07/14/2017 18:31   US Carotid Bilateral  Result Date: 07/15/2017 CLINICAL DATA:  Syncope. Hypertension, coronary artery disease, hyperlipidemia, diabetes. EXAM: BILATERAL CAROTID DUPLEX ULTRASOUND TECHNIQUE: Wallace Cullens scale imaging, color Doppler and duplex ultrasound was performed of bilateral carotid and vertebral arteries in the neck. COMPARISON:  None. TECHNIQUE: Quantification of carotid stenosis is based on velocity parameters that correlate the residual internal carotid diameter with NASCET-based stenosis levels, using the diameter of the distal internal carotid lumen as the denominator for stenosis measurement. The following velocity measurements were obtained: PEAK SYSTOLIC/END DIASTOLIC RIGHT ICA:                     64/8cm/sec CCA:                     62/5cm/sec SYSTOLIC ICA/CCA RATIO:  0.9 ECA:                     110cm/sec LEFT ICA:                     81/12cm/sec CCA:                     54/6cm/sec SYSTOLIC ICA/CCA RATIO:  1 ECA:                     160cm/sec FINDINGS: RIGHT CAROTID ARTERY: Mild irregular plaque in the distal common carotid artery and bulb, proximal internal and external carotid arteries. No high-grade stenosis. Normal waveforms and color Doppler signal. RIGHT VERTEBRAL ARTERY:  Normal flow direction and waveform. LEFT CAROTID ARTERY: Mild irregular plaque through the length of the common carotid artery, in the  bulb and proximal ICA. No high-grade stenosis. Normal waveforms and color Doppler signal. LEFT VERTEBRAL ARTERY: Normal flow direction and waveform. IMPRESSION: 1. Extensive nonocclusive bilateral carotid plaque resulting in less than 50% diameter stenosis. 2.  Antegrade bilateral vertebral arterial flow. Electronically Signed  By: Corlis Leak M.D.   On: 07/15/2017 10:51   Dg Chest Port 1 View  Result Date: 07/14/2017 CLINICAL DATA:  Status post CPR. EXAM: PORTABLE CHEST 1 VIEW COMPARISON:  10/10/2009. FINDINGS: Poor inspiration. Minimal patchy opacity at both medial lung bases. Otherwise, clear lungs. Stable post CABG changes. Thoracic spine degenerative changes. Stable mildly enlarged cardiac silhouette. IMPRESSION: Poor inspiration with minimal patchy bibasilar atelectasis or aspiration pneumonitis. Electronically Signed   By: Beckie Salts M.D.   On: 07/14/2017 18:26    EKG:   Orders placed or performed during the hospital encounter of 07/14/17  . ED EKG  . ED EKG  . EKG 12-Lead  . EKG 12-Lead      Management plans discussed with the patient, family and they are in agreement.  CODE STATUS:     Code Status Orders  (From admission, onward)        Start     Ordered   07/14/17 2204  Full code  Continuous     07/14/17 2203    Code Status History    This patient has a current code status but no historical code status.    Advance Directive Documentation     Most Recent Value  Type of Advance Directive  Healthcare Power of Attorney, Living will  Pre-existing out of facility DNR order (yellow form or pink MOST form)  -  "MOST" Form in Place?  -      TOTAL TIME TAKING CARE OF THIS PATIENT: 45 minutes.    Evelena Asa Markus Casten M.D on 07/16/2017 at 10:25 AM  Between 7am to 6pm - Pager - [phone]  After 6pm go to [email or web address] - password [email or web address]  [facility] [place]  Office  [phone]  CC: Primary care physician; [name], MD   Note: This dictation  was prepared with Dragon dictation along with smaller phrase technology. Any transcriptional errors that result from this process are unintentional.

## 2017-07-16 NOTE — Progress Notes (Signed)
Discharge instructions explained to pt and pts husband/ verbalized an understanding/ iv and tele removed/ RX given to pt/ will transport off  Unit via wheelchair.

## 2017-07-17 NOTE — Consult Note (Signed)
Reason for Consult: Syncope coronary artery disease borderline troponin Referring Physician: Dr Leslye Peer hospitalist,  Cardiologist Clayborn Bigness  Elizabeth [name] is an 84 y.o. female.  HPI: Patient reportedly had an episode of syncope at home witnessed by her husband.  The patient also received manual CPR by her husband he states that she quit breathing for a few minutes.  Patient also was brought in by rescue and evaluated in the emergency room she was hemodynamically stable then was admitted to telemetry with borderline troponins denies any chest pain denied any palpitations tachycardia  no worsening heart rate.  Patient complained of generalized weakness fatigue but no significant pain  Past Medical History:  Diagnosis Date  . Diabetes mellitus without complication ([place])   . Hyperlipidemia   . Hypertension   . Peripheral arterial disease [facility])     Past Surgical History:  Procedure Laterality Date  . CARDIAC SURGERY     triple bypass  . FEMORAL-FEMORAL BYPASS GRAFT    . JOINT REPLACEMENT     right hip  . LEG SURGERY Left    leg bypass    Family History  Problem Relation Age of Onset  . Diabetes Mother     Social History:  reports that she has quit smoking. Her smoking use included cigarettes. She has never used smokeless tobacco. She reports that she does not drink alcohol or use drugs.  Allergies:  Allergies  Allergen Reactions  . Meperidine Nausea Only    Medications: I have reviewed the patient's current medications.  Results for orders placed or performed during the hospital encounter of 07/14/17 (from the past 48 hour(s))  Glucose, capillary     Status: None   Collection Time: 07/15/17  4:15 PM  Result Value Ref Range   Glucose-Capillary 88 65 - 99 mg/dL  Sodium, urine, random     Status: None   Collection Time: 07/15/17  8:26 PM  Result Value Ref Range   Sodium, Ur 55 mmol/L    Comment: Performed at [facility], [address] [place], [place]  [address]  Osmolality, urine     Status: None   Collection Time: 07/15/17  8:26 PM  Result Value Ref Range   Osmolality, Ur 306 300 - 900 mOsm/kg    Comment: Performed at [facility] [address] [place], [place] [address]  Glucose, capillary     Status: Abnormal   Collection Time: 07/15/17  9:46 PM  Result Value Ref Range   Glucose-Capillary 109 (H) 65 - 99 mg/dL  Basic metabolic panel     Status: Abnormal   Collection Time: 07/16/17  6:39 AM  Result Value Ref Range   Sodium 131 (L) 135 - 145 mmol/L   Potassium 5.2 (H) 3.5 - 5.1 mmol/L   Chloride 102 101 - 111 mmol/L   CO2 19 (L) 22 - 32 mmol/L   Glucose, Bld 79 65 - 99 mg/dL   BUN 38 (H) 6 - 20 mg/dL   Creatinine, Ser 2.17 (H) 0.44 - 1.00 mg/dL   Calcium 9.3 8.9 - 10.3 mg/dL   GFR calc non Af Amer 19 (L) >60 mL/min   GFR calc Af Amer 23 (L) >60 mL/min    Comment: (NOTE) The eGFR has been calculated using the CKD EPI equation. This calculation has not been validated in all clinical situations. eGFR's persistently <60 mL/min signify possible Chronic Kidney Disease.    Anion gap 10 5 - 15    Comment: Performed at [facility], [place], [place] [address]  TSH     Status: None   Collection Time: 07/16/17  6:39 AM  Result Value Ref Range   TSH 0.744 0.350 - 4.500 uIU/mL    Comment: Performed by a 3rd Generation assay with a functional sensitivity of <=0.01 uIU/mL. Performed at [facility], [address] [place], [place] [address]   Glucose, capillary     Status: None   Collection Time: 07/16/17  7:36 AM  Result Value Ref Range   Glucose-Capillary 68 65 - 99 mg/dL   Comment 1 Notify RN   Glucose, capillary     Status: Abnormal   Collection Time: 07/16/17 11:27 AM  Result Value Ref Range   Glucose-Capillary 189 (H) 65 - 99 mg/dL   Comment 1 Notify RN     No results found.  Review of Systems  Constitutional: Positive for malaise/fatigue.  HENT: Negative.   Eyes: Negative.    Respiratory: Positive for shortness of breath.   Cardiovascular: Positive for chest pain, palpitations and claudication.  Gastrointestinal: Positive for heartburn.  Genitourinary: Negative.   Musculoskeletal: Positive for back pain, falls and myalgias.  Skin: Negative.   Neurological: Positive for dizziness and loss of consciousness.  Endo/Heme/Allergies: Negative.   Psychiatric/Behavioral: Positive for memory loss. The patient is nervous/anxious.    Blood pressure (!) 128/50, pulse 62, temperature 98.5 F (36.9 C), resp. rate 18, height 5' 4"  (1.626 m), weight 157 lb 3.2 oz (71.3 kg), SpO2 92 %. Physical Exam  Nursing note and vitals reviewed. Constitutional: She is oriented to person, place, and time. She appears well-developed and well-nourished.  HENT:  Head: Normocephalic and atraumatic.  Eyes: Pupils are equal, round, and reactive to light. Conjunctivae and EOM are normal.  Neck: Normal range of motion. Neck supple.  Cardiovascular: Normal rate.  Murmur heard. Respiratory: Effort normal and breath sounds normal.  GI: Soft. Bowel sounds are normal.  Musculoskeletal: Normal range of motion.  Neurological: She is alert and oriented to person, place, and time. She has normal reflexes.  Skin: Skin is warm.  Psychiatric: She has a normal mood and affect.    Assessment/Plan: Syncope Vertigo Coronary artery disease Peripheral vascular disease Diabetes Hyperlipidemia Chronic renal insufficiency Borderline troponins Hyponatremia . Plan Hyponatremia correction Continue diabetes management and control Consider neurology involvement Recommend Holter monitor for further assessment Follow-up with nephrology for renal insufficiency Gentle hydration hold diuretics Echocardiogram for assessment of syncope and murmur Carotid Doppler for further assessment   Dwayne D Callwood 07/17/2017, 12:14 PM

## 2017-07-22 LAB — ECHOCARDIOGRAM COMPLETE
Height: 64 in
Weight: 2585.6 oz

## 2017-07-30 ENCOUNTER — Inpatient Hospital Stay
Admission: AD | Admit: 2017-07-30 | Discharge: 2017-08-05 | DRG: 441 | Disposition: A | Discharge status: Hospice | Payer: Medicare Other | Source: Ambulatory Visit | Attending: Internal Medicine | Admitting: Internal Medicine

## 2017-07-30 ENCOUNTER — Inpatient Hospital Stay: Payer: Medicare Other

## 2017-07-30 ENCOUNTER — Other Ambulatory Visit: Payer: Self-pay

## 2017-07-30 ENCOUNTER — Encounter: Payer: Self-pay | Admitting: Specialist

## 2017-07-30 DIAGNOSIS — Z87891 Personal history of nicotine dependence: Secondary | ICD-10-CM | POA: Diagnosis not present

## 2017-07-30 DIAGNOSIS — E785 Hyperlipidemia, unspecified: Secondary | ICD-10-CM | POA: Diagnosis present

## 2017-07-30 DIAGNOSIS — E1122 Type 2 diabetes mellitus with diabetic chronic kidney disease: Secondary | ICD-10-CM | POA: Diagnosis present

## 2017-07-30 DIAGNOSIS — K8020 Calculus of gallbladder without cholecystitis without obstruction: Secondary | ICD-10-CM | POA: Diagnosis present

## 2017-07-30 DIAGNOSIS — I2510 Atherosclerotic heart disease of native coronary artery without angina pectoris: Secondary | ICD-10-CM | POA: Diagnosis present

## 2017-07-30 DIAGNOSIS — I130 Hypertensive heart and chronic kidney disease with heart failure and stage 1 through stage 4 chronic kidney disease, or unspecified chronic kidney disease: Secondary | ICD-10-CM | POA: Diagnosis not present

## 2017-07-30 DIAGNOSIS — N184 Chronic kidney disease, stage 4 (severe): Secondary | ICD-10-CM | POA: Diagnosis not present

## 2017-07-30 DIAGNOSIS — N179 Acute kidney failure, unspecified: Secondary | ICD-10-CM | POA: Diagnosis present

## 2017-07-30 DIAGNOSIS — Z95828 Presence of other vascular implants and grafts: Secondary | ICD-10-CM

## 2017-07-30 DIAGNOSIS — F0390 Unspecified dementia without behavioral disturbance: Secondary | ICD-10-CM | POA: Diagnosis present

## 2017-07-30 DIAGNOSIS — R1310 Dysphagia, unspecified: Secondary | ICD-10-CM

## 2017-07-30 DIAGNOSIS — D696 Thrombocytopenia, unspecified: Secondary | ICD-10-CM | POA: Diagnosis present

## 2017-07-30 DIAGNOSIS — G92 Toxic encephalopathy: Secondary | ICD-10-CM | POA: Diagnosis present

## 2017-07-30 DIAGNOSIS — E1151 Type 2 diabetes mellitus with diabetic peripheral angiopathy without gangrene: Secondary | ICD-10-CM | POA: Diagnosis present

## 2017-07-30 DIAGNOSIS — R791 Abnormal coagulation profile: Secondary | ICD-10-CM | POA: Diagnosis not present

## 2017-07-30 DIAGNOSIS — I255 Ischemic cardiomyopathy: Secondary | ICD-10-CM | POA: Diagnosis present

## 2017-07-30 DIAGNOSIS — Z7401 Bed confinement status: Secondary | ICD-10-CM

## 2017-07-30 DIAGNOSIS — Z951 Presence of aortocoronary bypass graft: Secondary | ICD-10-CM

## 2017-07-30 DIAGNOSIS — Z66 Do not resuscitate: Secondary | ICD-10-CM | POA: Diagnosis present

## 2017-07-30 DIAGNOSIS — E875 Hyperkalemia: Secondary | ICD-10-CM | POA: Diagnosis present

## 2017-07-30 DIAGNOSIS — D539 Nutritional anemia, unspecified: Secondary | ICD-10-CM | POA: Diagnosis not present

## 2017-07-30 DIAGNOSIS — K7200 Acute and subacute hepatic failure without coma: Secondary | ICD-10-CM | POA: Diagnosis present

## 2017-07-30 DIAGNOSIS — I501 Left ventricular failure: Secondary | ICD-10-CM | POA: Diagnosis not present

## 2017-07-30 DIAGNOSIS — K767 Hepatorenal syndrome: Principal | ICD-10-CM | POA: Diagnosis present

## 2017-07-30 DIAGNOSIS — R7989 Other specified abnormal findings of blood chemistry: Secondary | ICD-10-CM | POA: Diagnosis not present

## 2017-07-30 DIAGNOSIS — N170 Acute kidney failure with tubular necrosis: Secondary | ICD-10-CM | POA: Diagnosis not present

## 2017-07-30 DIAGNOSIS — Z79899 Other long term (current) drug therapy: Secondary | ICD-10-CM

## 2017-07-30 DIAGNOSIS — R945 Abnormal results of liver function studies: Secondary | ICD-10-CM | POA: Diagnosis not present

## 2017-07-30 DIAGNOSIS — E871 Hypo-osmolality and hyponatremia: Secondary | ICD-10-CM | POA: Diagnosis present

## 2017-07-30 DIAGNOSIS — R4182 Altered mental status, unspecified: Secondary | ICD-10-CM | POA: Diagnosis not present

## 2017-07-30 DIAGNOSIS — E860 Dehydration: Secondary | ICD-10-CM | POA: Diagnosis present

## 2017-07-30 DIAGNOSIS — Z888 Allergy status to other drugs, medicaments and biological substances status: Secondary | ICD-10-CM | POA: Diagnosis not present

## 2017-07-30 DIAGNOSIS — I5022 Chronic systolic (congestive) heart failure: Secondary | ICD-10-CM | POA: Diagnosis present

## 2017-07-30 DIAGNOSIS — Z515 Encounter for palliative care: Secondary | ICD-10-CM | POA: Diagnosis present

## 2017-07-30 DIAGNOSIS — D573 Sickle-cell trait: Secondary | ICD-10-CM | POA: Diagnosis present

## 2017-07-30 DIAGNOSIS — Z885 Allergy status to narcotic agent status: Secondary | ICD-10-CM

## 2017-07-30 DIAGNOSIS — K760 Fatty (change of) liver, not elsewhere classified: Secondary | ICD-10-CM | POA: Diagnosis present

## 2017-07-30 DIAGNOSIS — Z833 Family history of diabetes mellitus: Secondary | ICD-10-CM

## 2017-07-30 DIAGNOSIS — R0602 Shortness of breath: Secondary | ICD-10-CM

## 2017-07-30 DIAGNOSIS — Z7982 Long term (current) use of aspirin: Secondary | ICD-10-CM

## 2017-07-30 DIAGNOSIS — B179 Acute viral hepatitis, unspecified: Secondary | ICD-10-CM | POA: Diagnosis present

## 2017-07-30 DIAGNOSIS — Z96641 Presence of right artificial hip joint: Secondary | ICD-10-CM | POA: Diagnosis present

## 2017-07-30 DIAGNOSIS — Z7984 Long term (current) use of oral hypoglycemic drugs: Secondary | ICD-10-CM

## 2017-07-30 DIAGNOSIS — E872 Acidosis: Secondary | ICD-10-CM | POA: Diagnosis present

## 2017-07-30 DIAGNOSIS — L89151 Pressure ulcer of sacral region, stage 1: Secondary | ICD-10-CM | POA: Diagnosis present

## 2017-07-30 LAB — BLOOD GAS, ARTERIAL
Acid-base deficit: 9.5 mmol/L — ABNORMAL HIGH (ref 0.0–2.0)
Bicarbonate: 13.3 mmol/L — ABNORMAL LOW (ref 20.0–28.0)
FIO2: 0.21
O2 SAT: 98 %
PATIENT TEMPERATURE: 37
pCO2 arterial: 21 mmHg — ABNORMAL LOW (ref 32.0–48.0)
pH, Arterial: 7.41 (ref 7.350–7.450)
pO2, Arterial: 103 mmHg (ref 83.0–108.0)

## 2017-07-30 LAB — LACTATE DEHYDROGENASE: LDH: 974 U/L — AB (ref 98–192)

## 2017-07-30 LAB — CBC
HEMATOCRIT: 37.9 % (ref 35.0–47.0)
HEMOGLOBIN: 12.5 g/dL (ref 12.0–16.0)
MCH: 25.6 pg — ABNORMAL LOW (ref 26.0–34.0)
MCHC: 33 g/dL (ref 32.0–36.0)
MCV: 77.8 fL — ABNORMAL LOW (ref 80.0–100.0)
Platelets: 85 10*3/uL — ABNORMAL LOW (ref 150–440)
RBC: 4.87 MIL/uL (ref 3.80–5.20)
RDW: 15.1 % — ABNORMAL HIGH (ref 11.5–14.5)
WBC: 9.8 10*3/uL (ref 3.6–11.0)

## 2017-07-30 LAB — GLUCOSE, CAPILLARY
Glucose-Capillary: 251 mg/dL — ABNORMAL HIGH (ref 65–99)
Glucose-Capillary: 219 mg/dL — ABNORMAL HIGH (ref 65–99)

## 2017-07-30 LAB — CREATININE, SERUM
Creatinine, Ser: 3.95 mg/dL — ABNORMAL HIGH (ref 0.44–1.00)
GFR calc non Af Amer: 9 mL/min — ABNORMAL LOW (ref >60)
GFR, EST AFRICAN AMERICAN: 11 mL/min — AB (ref >60)

## 2017-07-30 LAB — TROPONIN I: TROPONIN I: 0.27 ng/mL — AB (ref <0.03)

## 2017-07-30 LAB — LACTIC ACID, PLASMA: LACTIC ACID, VENOUS: 3.9 mmol/L — AB (ref 0.5–1.9)

## 2017-07-30 MED ORDER — ONDANSETRON HCL 4 MG/2ML IJ SOLN: 4.0000 mg | Freq: Four times a day (QID) | INTRAMUSCULAR | Status: DC | PRN

## 2017-07-30 MED ORDER — SODIUM BICARBONATE 8.4 % IV SOLN
INTRAVENOUS | Status: DC
  Administered 2017-07-30 – 2017-07-31 (×2): via INTRAVENOUS
  Filled 2017-07-30 (×3): qty 150

## 2017-07-30 MED ORDER — INSULIN ASPART 100 UNIT/ML ~~LOC~~ SOLN
0.0000 [IU] | Freq: Three times a day (TID) | SUBCUTANEOUS | Status: DC
  Administered 2017-07-30: 5 [IU] via SUBCUTANEOUS
  Administered 2017-07-31: 3 [IU] via SUBCUTANEOUS
  Administered 2017-07-31: 5 [IU] via SUBCUTANEOUS
  Administered 2017-07-31 – 2017-08-03 (×8): 2 [IU] via SUBCUTANEOUS
  Filled 2017-07-30 (×11): qty 1

## 2017-07-30 MED ORDER — SODIUM CHLORIDE 0.9 % IV SOLN: INTRAVENOUS | Status: DC

## 2017-07-30 MED ORDER — ACETAMINOPHEN 325 MG PO TABS: 650.0000 mg | ORAL_TABLET | Freq: Four times a day (QID) | ORAL | Status: DC | PRN

## 2017-07-30 MED ORDER — NIACIN ER (ANTIHYPERLIPIDEMIC) 500 MG PO TBCR
500.0000 mg | EXTENDED_RELEASE_TABLET | Freq: Every day | ORAL | Status: DC
  Filled 2017-07-30 (×2): qty 1

## 2017-07-30 MED ORDER — EZETIMIBE 10 MG PO TABS
10.0000 mg | ORAL_TABLET | Freq: Every day | ORAL | Status: DC
  Administered 2017-08-01: 10 mg via ORAL
  Filled 2017-07-30 (×3): qty 1

## 2017-07-30 MED ORDER — PATIROMER SORBITEX CALCIUM 8.4 G PO PACK
8.4000 g | PACK | Freq: Every day | ORAL | Status: DC
  Filled 2017-07-30 (×4): qty 1

## 2017-07-30 MED ORDER — INSULIN ASPART 100 UNIT/ML ~~LOC~~ SOLN
0.0000 [IU] | Freq: Every day | SUBCUTANEOUS | Status: DC
  Administered 2017-07-30: 2 [IU] via SUBCUTANEOUS
  Filled 2017-07-30: qty 1

## 2017-07-30 MED ORDER — HEPARIN SODIUM (PORCINE) 5000 UNIT/ML IJ SOLN: 5000.0000 [IU] | Freq: Three times a day (TID) | INTRAMUSCULAR | Status: DC

## 2017-07-30 MED ORDER — PATIROMER SORBITEX CALCIUM 8.4 G PO PACK: 8.4000 g | PACK | Freq: Every day | ORAL | Status: DC

## 2017-07-30 MED ORDER — ONDANSETRON HCL 4 MG PO TABS: 4.0000 mg | ORAL_TABLET | Freq: Four times a day (QID) | ORAL | Status: DC | PRN

## 2017-07-30 MED ORDER — METOPROLOL SUCCINATE ER 50 MG PO TB24
50.0000 mg | ORAL_TABLET | Freq: Every day | ORAL | Status: DC
  Administered 2017-08-01 – 2017-08-02 (×2): 50 mg via ORAL
  Filled 2017-07-30 (×2): qty 1

## 2017-07-30 MED ORDER — ACETAMINOPHEN 650 MG RE SUPP: 650.0000 mg | Freq: Four times a day (QID) | RECTAL | Status: DC | PRN

## 2017-07-30 MED ORDER — ASPIRIN EC 81 MG PO TBEC
81.0000 mg | DELAYED_RELEASE_TABLET | Freq: Every day | ORAL | Status: DC
  Administered 2017-08-01 – 2017-08-02 (×2): 81 mg via ORAL
  Filled 2017-07-30 (×2): qty 1

## 2017-07-30 NOTE — Progress Notes (Signed)
With Dr. Thedore Mins and patient has been having difficulty swallowing, difficulty ambulating and this is a sudden change from 3 to 4 days ago.  Possible underlying CVA.  Patient had a CT head on last admission which was negative.  - I will go ahead and get an MRI of the brain to rule out CVA.

## 2017-07-30 NOTE — Consult Note (Signed)
Date: 07/30/2017                  Patient Name:  SHALEKA OFFILL  MRN: [id number]  DOB: [birth year]  Age / Sex: 84 y.o., female         PCP: [name], MD                 Service Requesting Consult: IM/ Houston Siren, MD                 Reason for Consult: ARF            History of Present Illness: Patient is a 84 y.o. female with medical problems of diabetes, hypertension, hyperlipidemia, chronic kidney disease stage IV, peripheral vascular disease, triple-vessel CABG, femoral bypass, who [facility] 07[facility]30/2017 for evaluation of acute renal failure and elevated liver enzymes.  Patient is very confused and is not able to provide much information.  Her husband reports that after her previous admission, she was doing much better.  3 to 4 days after her discharge, she started to have difficulty with swallowing.  She became weak and could not walk.  Normally she is able to walk and communicate without difficulty.  She became progressively worse.  She had difficulty swallowing and was not able to take her medications and had poor food intake.  Today she was seen at her PMD office and some labs were drawn which showed potassium of 6.2, creatinine of 4.1, BUN 96, bicarbonate 18.3, sodium 128, glucose of 230 and elevated AST and ALT along with elevated bilirubin. Nephrology consult has been requested for evaluation Patient's husband also reports that patient had difficulty with voiding at home  Medications: Outpatient medications: Medications Prior to Admission  Medication Sig Dispense Refill Last Dose  . aspirin EC 81 MG tablet Take 81 mg by mouth daily.    07/29/2017 at Unknown time  . colchicine 0.6 MG tablet Take 1 tablet (0.6 mg total) by mouth daily as needed (PRN gout). 20 tablet 0   . ezetimibe (ZETIA) 10 MG tablet Take 10 mg by mouth daily.   07/29/2017 at Unknown time  . metoprolol succinate (TOPROL-XL) 50 MG 24 hr tablet TAKE 1 TABLET BY MOUTH  ONCE DAILY.   07/29/2017 at Unknown time  . niacin (NIASPAN) 500 MG CR tablet Take 500 mg by mouth at bedtime.    07/29/2017 at Unknown time  . sitaGLIPtin (JANUVIA) 25 MG tablet Take 1 tablet (25 mg total) by mouth daily. 30 tablet 0 07/29/2017 at Unknown time  . sodium chloride 1 g tablet Take 1 tablet (1 g total) by mouth 3 (three) times daily with meals. 6 tablet 0   . Calcium Carb-Cholecalciferol (CALCIUM-VITAMIN D) 500-200 MG-UNIT tablet Take 1 tablet by mouth 2 (two) times daily.    Not Taking at Unknown time    Current medications: Current Facility-Administered Medications  Medication Dose Route Frequency Provider Last Rate Last Dose  . acetaminophen (TYLENOL) tablet 650 mg  650 mg Oral Q6H PRN Houston Siren, MD       Or  . acetaminophen (TYLENOL) suppository 650 mg  650 mg Rectal Q6H PRN Houston Siren, MD      . aspirin EC tablet 81 mg  81 mg Oral Daily Sainani, Rolly Pancake, MD      . ezetimibe (ZETIA) tablet 10 mg  10 mg Oral Daily Houston Siren, MD      .  insulin aspart (novoLOG) injection 0-5 Units  0-5 Units Subcutaneous QHS Sainani, Vivek J, MD      . insulin aspart (novoLOG) injection 0-9 Units  0-9 Units Subcutaneous TID WC Houston Siren, MD   5 Units at 07/30/17 1710  . metoprolol succinate (TOPROL-XL) 24 hr tablet 50 mg  50 mg Oral Daily Sainani, Rolly Pancake, MD      . niacin (NIASPAN) CR tablet 500 mg  500 mg Oral QHS Sainani, Rolly Pancake, MD      . ondansetron (ZOFRAN) tablet 4 mg  4 mg Oral Q6H PRN Houston Siren, MD       Or  . ondansetron (ZOFRAN) injection 4 mg  4 mg Intravenous Q6H PRN Houston Siren, MD      . patiromer (VELTASSA) packet 8.4 g  8.4 g Oral Daily Sainani, Vivek J, MD      . sodium bicarbonate 150 mEq in dextrose 5 % 1,000 mL infusion   Intravenous Continuous Houston Siren, MD 100 mL/hr at 07/30/17 1744        Allergies: Allergies  Allergen Reactions  . Meperidine Nausea Only      Past Medical History: Past Medical History:  Diagnosis  Date  . Diabetes mellitus without complication ([facility])   . Hyperlipidemia   . Hypertension   . Peripheral arterial disease ([facility])      Past Surgical History: Past Surgical History:  Procedure Laterality Date  . CARDIAC SURGERY     triple bypass  . FEMORAL-FEMORAL BYPASS GRAFT    . JOINT REPLACEMENT     right hip  . LEG SURGERY Left    leg bypass     Family History: Family History  Problem Relation Age of Onset  . Diabetes Mother      Social History: Social History   Socioeconomic History  . Marital status: Married    Spouse name: Not on file  . Number of children: Not on file  . Years of education: Not on file  . Highest education level: Not on file  Occupational History  . Not on file  Social Needs  . Financial resource strain: Not on file  . Food insecurity:    Worry: Not on file    Inability: Not on file  . Transportation needs:    Medical: Not on file    Non-medical: Not on file  Tobacco Use  . Smoking status: Former Smoker    Types: Cigarettes  . Smokeless tobacco: Never Used  Substance and Sexual Activity  . Alcohol use: No  . Drug use: No  . Sexual activity: Not on file  Lifestyle  . Physical activity:    Days per week: Not on file    Minutes per session: Not on file  . Stress: Not on file  Relationships  . Social connections:    Talks on phone: Not on file    Gets together: Not on file    Attends religious service: Not on file    Active member of club or organization: Not on file    Attends meetings of clubs or organizations: Not on file    Relationship status: Not on file  . Intimate partner violence:    Fear of current or ex partner: Not on file    Emotionally abused: Not on file    Physically abused: Not on file    Forced sexual activity: Not on file  Other Topics Concern  . Not on file  Social History Narrative  .  Not on file     Review of Systems: Not reliable as patient has baseline dementia.  Her mental status is worse and at  this time she is not able to provide meaningful information Gen:  HEENT:  CV:  Resp:  GI: GU :  MS:  Derm:   Psych: Heme:  Neuro:  Endocrine  Vital Signs: Blood pressure (!) 164/86, pulse 87, SpO2 100 %.   Intake/Output Summary (Last 24 hours) at 07/30/2017 1832 Last data filed at 07/30/2017 1608 Gross per 24 hour  Intake -  Output 0 ml  Net 0 ml    Weight trends: There were no vitals filed for this visit.  Physical Exam: General:  Frail, elderly woman, laying in the bed  HEENT  moist oral mucous membranes  Neck:  Supple, no JVD  Lungs:  Mild basilar crackles, normal breathing effort  Heart::  Regular, no rub  Abdomen:  Soft, nontender  Extremities:  No peripheral edema  Neurologic:  Cranial nerves are grossly intact.  Upper body strength is symmetric, patient has  bilateral weakness in her legs  Skin:  No acute rashes             Lab results: Basic Metabolic Panel: Recent Labs  Lab 07/30/17 1613  CREATININE 3.95*    Liver Function Tests: No results for input(s): AST, ALT, ALKPHOS, BILITOT, PROT, ALBUMIN in the last 168 hours. No results for input(s): LIPASE, AMYLASE in the last 168 hours. No results for input(s): AMMONIA in the last 168 hours.  CBC: Recent Labs  Lab 07/30/17 1613  WBC 9.8  HGB 12.5  HCT 37.9  MCV 77.8*  PLT 85*    Cardiac Enzymes: No results for input(s): CKTOTAL, TROPONINI in the last 168 hours.  BNP: Invalid input(s): POCBNP  CBG: Recent Labs  Lab 07/30/17 1650  GLUCAP 251*    Microbiology: No results found for this or any previous visit (from the past 720 hour(s)).   Coagulation Studies: No results for input(s): LABPROT, INR in the last 72 hours.  Urinalysis: No results for input(s): COLORURINE, LABSPEC, PHURINE, GLUCOSEU, HGBUR, BILIRUBINUR, KETONESUR, PROTEINUR, UROBILINOGEN, NITRITE, LEUKOCYTESUR in the last 72 hours.  Invalid input(s): APPERANCEUR      Imaging: Dg Chest Port 1 View  Result Date:  07/30/2017 CLINICAL DATA:  84 year old female with altered mental status, lethargy. Acute on chronic renal failure, thrombocytopenia, abnormal LFTs. EXAM: PORTABLE CHEST 1 VIEW COMPARISON:  07/14/2017 portable chest and earlier. FINDINGS: Portable AP upright view at 1546 hours. Continued low lung volumes. Stable cardiomegaly and mediastinal contours. Prior CABG. New veiling opacity at the right lateral lung base, although most resembles a small pleural effusion. No pneumothorax or pulmonary edema. The left lung base appears clear. Negative visible bowel gas pattern. Stable visualized osseous structures. IMPRESSION: 1. New veiling opacity at the right lung base, but more resembles a small pleural effusion than pneumonia. 2. Otherwise stable chest: Chronic low lung volumes and cardiomegaly. Electronically Signed   By: Odessa Fleming M.D.   On: 07/30/2017 16:07      Assessment & Plan: Pt is a 84 y.o.   female with  diabetes, hypertension, hyperlipidemia, chronic kidney disease stage IV, peripheral vascular disease, triple-vessel CABG, chronic systolic congestive heart failure with LVEF 20 to 25% from echo in May 2019, history of femoral bypass, who was admitted to [facility] directly from PMD office on 07/30/2017 for evaluation of acute renal failure and elevated liver enzymes.   1.  Acute kidney injury 2.  Chronic kidney disease stage IV, baseline creatinine 2.17/GFR 23 from 07/16/2017 3.  Severe hyperkalemia 4.  Metabolic acidosis/ Lactic acidosis 5.  Acute hepatitis/transaminitis 6.  Altered mental status 7.  Thrombocytopenia 8.  Chronic systolic congestive heart failure with LVEF 20 to 25% from May 2019  Work-up so far shows acute kidney injury, acute liver injury, elevated lactic acidosis and number cytopenia.  Discussed with Dr. Cherlynn Kaiser to obtain peripheral smear evaluation to rule out TTP.  Patient has new right basilar opacity suggesting possibility of pneumonia.  With history of difficulty swallowing  aspiration is a possibility which could have led to pneumonia.  We plan to obtain urinalysis, renal ultrasound.  Patient will get right upper quadrant ultrasound in the morning.  Discussed with Dr. Cherlynn Kaiser to obtain MRI as patient has evidence of encephalomalacia and previous strokes.  Agree with treatment of hyperkalemia with oral shifting measures if patient is able to swallow.  Otherwise Kayexalate enema could be considered.  Agree with sodium bicarbonate infusion to correct acidosis.  No clear source of transaminitis.  We will try to treat electrolyte abnormalities medically.  No acute indication for dialysis at present. Will continue to follow    LOS: 0 Jehan Ranganathan Thedore Mins 6/12/20196:32 PM  [facility] [place], [place] [phone]  Note: This note was prepared with Dragon dictation. Any transcription errors are unintentional

## 2017-07-30 NOTE — H&P (Signed)
[employer] - [place] [facility]   PATIENT NAME: Elizabeth [name]    MR#:  [id number]  DATE OF BIRTH:  January 11, 1930  DATE OF ADMISSION:  07/30/2017  PRIMARY CARE PHYSICIAN: [name], MD   REQUESTING/REFERRING PHYSICIAN: Dr. Marcelino Duster  CHIEF COMPLAINT:  No chief complaint on file. Altered mental status/lethargy.  HISTORY OF PRESENT ILLNESS:  Elizabeth [name]  is a 84 y.o. female with a known history of diabetes, hypertension, hyperlipidemia, chronic kidney disease stage III, recent admission for hyponatremia who presents to the hospital directly admitted from her primary care physician's office due to altered mental status, lethargy and weakness.  Patient herself is a poor historian therefore most of the history obtained from the niece at bedside.  As per the niece patient was just discharged from the hospital 2 weeks ago and since then has been not doing well.  Patient has been more confused, lethargic her appetite has been poor.  She also has been trying to eat and drink and gets choked up when she tries to eat.  She had some nausea vomiting today but denies any abdominal pain.  She continues to be confused and therefore went to see her primary care physician who did some basic blood work and noted her to be in acute on chronic renal failure with thrombocytopenia with abnormal LFTs and therefore sent her to the hospital for direct admission.  PAST MEDICAL HISTORY:   Past Medical History:  Diagnosis Date  . Diabetes mellitus without complication ([facility])   . Hyperlipidemia   . Hypertension   . Peripheral arterial disease ([facility])     PAST SURGICAL HISTORY:   Past Surgical History:  Procedure Laterality Date  . CARDIAC SURGERY     triple bypass  . FEMORAL-FEMORAL BYPASS GRAFT    . JOINT REPLACEMENT     right hip  . LEG SURGERY Left    leg bypass    SOCIAL HISTORY:   Social History   Tobacco Use  . Smoking status: Former Smoker    Types: Cigarettes  .  Smokeless tobacco: Never Used  Substance Use Topics  . Alcohol use: No    FAMILY HISTORY:   Family History  Problem Relation Age of Onset  . Diabetes Mother     DRUG ALLERGIES:   Allergies  Allergen Reactions  . Meperidine Nausea Only    REVIEW OF SYSTEMS:   Review of Systems  Unable to perform ROS: Mental acuity    MEDICATIONS AT HOME:   Prior to Admission medications   Medication Sig Start Date End Date Taking? Authorizing Provider  aspirin EC 81 MG tablet Take 81 mg by mouth daily.     [provider]  Calcium Carb-Cholecalciferol (CALCIUM-VITAMIN D) 500-200 MG-UNIT tablet Take 1 tablet by mouth 2 (two) times daily.     [provider]  colchicine 0.6 MG tablet Take 1 tablet (0.6 mg total) by mouth daily as needed (PRN gout). 07/16/17   Salary, Evelena Asa, MD  ezetimibe (ZETIA) 10 MG tablet Take 10 mg by mouth daily. 07/14/17   [provider]  metoprolol succinate (TOPROL-XL) 50 MG 24 hr tablet TAKE 1 TABLET BY MOUTH ONCE DAILY. 07/28/15   [provider]  niacin (NIASPAN) 500 MG CR tablet Take 500 mg by mouth at bedtime.  05/04/15 07/15/18  [provider]  sitaGLIPtin (JANUVIA) 25 MG tablet Take 1 tablet (25 mg total) by mouth daily. 07/16/17   Salary, Evelena Asa, MD  sodium chloride  1 g tablet Take 1 tablet (1 g total) by mouth 3 (three) times daily with meals. 07/16/17   Salary, Jetty Duhamel D, MD      VITAL SIGNS:  Blood pressure (!) 164/86, pulse 87, SpO2 100 %.  PHYSICAL EXAMINATION:  Physical Exam  GENERAL:  84 y.o.-year-old patient lying in the bed confused/encephalopathic but follows commands.  EYES: Pupils equal, round, reactive to light and accommodation. No scleral icterus. Extraocular muscles intact.  HEENT: Head atraumatic, normocephalic. Oropharynx and nasopharynx clear. No oropharyngeal erythema, dry oral mucosa  NECK:  Supple, no jugular venous distention. No thyroid enlargement, no tenderness.  LUNGS: Normal breath  sounds bilaterally, no wheezing, rales, rhonchi. No use of accessory muscles of respiration.  CARDIOVASCULAR: S1, S2 RRR. No murmurs, rubs, gallops, clicks.  ABDOMEN: Soft, nontender, nondistended. Bowel sounds present. No organomegaly or mass.  EXTREMITIES: No pedal edema, cyanosis, or clubbing. + 2 pedal & radial pulses b/l.   NEUROLOGIC: Cranial nerves II through XII are intact. No focal Motor or sensory deficits appreciated b/l. Globally weak.  PSYCHIATRIC: The patient is alert and oriented x 1.   SKIN: No obvious rash, lesion, or ulcer.   LABORATORY PANEL:   CBC No results for input(s): WBC, HGB, HCT, PLT in the last 168 hours. ------------------------------------------------------------------------------------------------------------------  Chemistries  No results for input(s): NA, K, CL, CO2, GLUCOSE, BUN, CREATININE, CALCIUM, MG, AST, ALT, ALKPHOS, BILITOT in the last 168 hours.  Invalid input(s): GFRCGP ------------------------------------------------------------------------------------------------------------------  Cardiac Enzymes No results for input(s): TROPONINI in the last 168 hours. ------------------------------------------------------------------------------------------------------------------  RADIOLOGY:  No results found.   IMPRESSION AND PLAN:   84 year old female with past medical history of diabetes, hypertension, peripheral artery disease, recent admission for hyponatremia, chronic kidney disease stage III who presents to the hospital from direct admission due to altered mental status/encephalopathy and acute on chronic renal failure.  1.  Altered mental status-metabolic encephalopathy secondary to acute on chronic renal failure, hyponatremia. - We will get chest x-ray, urinalysis to rule out underlying infectious process. - We will hydrate her with IV fluids/Sodium Bicarb and follow mental status.   2.  Acute on chronic renal failure-patient has chronic  kidney disease stage III with baseline creatinine 2.0.  Creatinine now is up to 4.0. -This is secondary to dehydration.  We will give the patient sodium bicarbonate, follow BUN and creatinine and urine output. -We will get a nephrology consult.  We will also get a renal ultrasound, urinalysis.  3.  Hyperkalemia- secondary to the acute on chronic renal failure. - Start patient on V[place], follow potassium.  Keep on telemetry.  4.  Abnormal LFTs-etiology unclear.  Will get abdominal ultrasound, patient is not hypotensive therefore this is unlikely shock liver.  5.  Thrombocytopenia-etiology unclear.  Await abdominal ultrasound.  Given patient's mental status change, worsening renal failure and now thrombocytopenia concern for possible underlying TTP. -We will check peripheral smear, consult hematology oncology.  Hold off on heparin/Lovenox for DVT prophylaxis.   6.  Essential hypertension-continue Toprol.  7.  Diabetes type 2 without complication-placed on sliding scale insulin.  Hold Januvia, glipizide.  8.  Hyperlipidemia-continue Zetia.    All the records are reviewed and case discussed with ED provider. Management plans discussed with the patient, family and they are in agreement.  CODE STATUS: Full code  TOTAL TIME TAKING CARE OF THIS PATIENT: 50 minutes.    Houston Siren M.D on 07/30/2017 at 3:44 PM  Between 7am to 6pm - Pager - [phone]  After 6pm go to  [email or web address] - password [email or web address] [facility]  [facility]  Office  [phone]  CC: Primary care physician; [name], MD

## 2017-07-31 ENCOUNTER — Inpatient Hospital Stay: Payer: Medicare Other

## 2017-07-31 DIAGNOSIS — R7989 Other specified abnormal findings of blood chemistry: Secondary | ICD-10-CM

## 2017-07-31 DIAGNOSIS — N184 Chronic kidney disease, stage 4 (severe): Secondary | ICD-10-CM

## 2017-07-31 DIAGNOSIS — D539 Nutritional anemia, unspecified: Secondary | ICD-10-CM

## 2017-07-31 DIAGNOSIS — R4182 Altered mental status, unspecified: Secondary | ICD-10-CM

## 2017-07-31 DIAGNOSIS — I501 Left ventricular failure: Secondary | ICD-10-CM

## 2017-07-31 DIAGNOSIS — E1122 Type 2 diabetes mellitus with diabetic chronic kidney disease: Secondary | ICD-10-CM

## 2017-07-31 DIAGNOSIS — I130 Hypertensive heart and chronic kidney disease with heart failure and stage 1 through stage 4 chronic kidney disease, or unspecified chronic kidney disease: Secondary | ICD-10-CM

## 2017-07-31 LAB — CBC
HCT: 33.4 % — ABNORMAL LOW (ref 35.0–47.0)
Hemoglobin: 10.9 g/dL — ABNORMAL LOW (ref 12.0–16.0)
MCH: 25 pg — ABNORMAL LOW (ref 26.0–34.0)
MCHC: 32.7 g/dL (ref 32.0–36.0)
MCV: 76.6 fL — AB (ref 80.0–100.0)
PLATELETS: 72 10*3/uL — AB (ref 150–440)
RBC: 4.36 MIL/uL (ref 3.80–5.20)
RDW: 14.7 % — AB (ref 11.5–14.5)
WBC: 10 10*3/uL (ref 3.6–11.0)

## 2017-07-31 LAB — FIBRINOGEN: Fibrinogen: 63 mg/dL — CL (ref 210–475)

## 2017-07-31 LAB — PROTIME-INR
INR: 2.68
PROTHROMBIN TIME: 28.3 s — AB (ref 11.4–15.2)

## 2017-07-31 LAB — TROPONIN I
TROPONIN I: 0.3 ng/mL — AB (ref <0.03)
Troponin I: 0.33 ng/mL (ref <0.03)

## 2017-07-31 LAB — COMPREHENSIVE METABOLIC PANEL
ALBUMIN: 3.8 g/dL (ref 3.5–5.0)
ALT: 1004 U/L — ABNORMAL HIGH (ref 14–54)
ANION GAP: 15 (ref 5–15)
AST: 878 U/L — ABNORMAL HIGH (ref 15–41)
Alkaline Phosphatase: 162 U/L — ABNORMAL HIGH (ref 38–126)
BILIRUBIN TOTAL: 3.9 mg/dL — AB (ref 0.3–1.2)
BUN: 99 mg/dL — ABNORMAL HIGH (ref 6–20)
CHLORIDE: 93 mmol/L — AB (ref 101–111)
CO2: 22 mmol/L (ref 22–32)
Calcium: 9.3 mg/dL (ref 8.9–10.3)
Creatinine, Ser: 3.58 mg/dL — ABNORMAL HIGH (ref 0.44–1.00)
GFR calc Af Amer: 12 mL/min — ABNORMAL LOW (ref >60)
GFR, EST NON AFRICAN AMERICAN: 11 mL/min — AB (ref >60)
Glucose, Bld: 274 mg/dL — ABNORMAL HIGH (ref 65–99)
POTASSIUM: 5.5 mmol/L — AB (ref 3.5–5.1)
Sodium: 130 mmol/L — ABNORMAL LOW (ref 135–145)
TOTAL PROTEIN: 6 g/dL — AB (ref 6.5–8.1)

## 2017-07-31 LAB — GLUCOSE, CAPILLARY
GLUCOSE-CAPILLARY: 217 mg/dL — AB (ref 65–99)
Glucose-Capillary: 261 mg/dL — ABNORMAL HIGH (ref 65–99)
Glucose-Capillary: 194 mg/dL — ABNORMAL HIGH (ref 65–99)
Glucose-Capillary: 158 mg/dL — ABNORMAL HIGH (ref 65–99)

## 2017-07-31 LAB — FOLATE: Folate: 13.9 ng/mL (ref >5.9)

## 2017-07-31 LAB — APTT: APTT: 35 s (ref 24–36)

## 2017-07-31 LAB — PATHOLOGIST SMEAR REVIEW

## 2017-07-31 MED ORDER — SODIUM CHLORIDE 0.9 % IV SOLN
INTRAVENOUS | Status: AC
  Administered 2017-07-31 – 2017-08-01 (×2): via INTRAVENOUS

## 2017-07-31 NOTE — Consult Note (Signed)
Hematology/Oncology Consult note [facility] [phone] [phone]  Patient Care Team: [name], MD as PCP - General (Internal Medicine)   Name of the patient: Elizabeth [name]  [id number]  February 09, 1930    Reason for consult: thrombocytopenia   Requesting physician: Dr. Katheren Shams  Date of visit: 07/31/2017    History of presenting illness-patient is a 84 year old female with a past medical history significant for hypertension hyperlipidemia stage IV CKD type 2 diabetes who was sent from the primary care doctor's office for abnormal labs including thrombocytopenia and elevated LFTs.  She was recently discharged from the hospital 2 weeks ago and has not been doing well since then.  On presentation patient was noted to have altered mental status along with lethargy and weakness.  Family reports worsening SOB over last few days. She has not been able to drink and eat as much and each time she eats causes her to feel nauseous and throw up  She is currently being treated with IV fluids and we have been consulted for her thrombocytopenia.  Patient is also known to have chronic left ventricular failure and her recent echocardiogram from May 2019 showed a depressed EF of 20 to 25%.  Family reports she is doing better and confusion is resolving though not back to baseline   Review of systems-limited as patient does not verbalize much   Review of Systems  Constitutional: Positive for malaise/fatigue. Negative for chills, fever and weight loss.  HENT: Negative for congestion, ear discharge and nosebleeds.   Eyes: Negative for blurred vision.  Respiratory: Negative for cough, hemoptysis, sputum production, shortness of breath and wheezing.   Cardiovascular: Negative for chest pain, palpitations, orthopnea and claudication.  Gastrointestinal: Negative for abdominal pain, blood in stool, constipation, diarrhea, heartburn, melena, nausea and vomiting.    Genitourinary: Negative for dysuria, flank pain, frequency, hematuria and urgency.  Musculoskeletal: Negative for back pain, joint pain and myalgias.  Skin: Negative for rash.  Neurological: Negative for dizziness, tingling, focal weakness, seizures, weakness and headaches.  Endo/Heme/Allergies: Does not bruise/bleed easily.  Psychiatric/Behavioral: Negative for depression and suicidal ideas. The patient does not have insomnia.     Allergies  Allergen Reactions  . Meperidine Nausea Only    Patient Active Problem List   Diagnosis Date Noted  . Acute renal failure (ARF) (HCC) 07/30/2017  . Pressure injury of skin 07/15/2017  . Hyponatremia syndrome 07/14/2017  . AAA (abdominal aortic aneurysm) without rupture (HCC) 02/28/2016  . PAD (peripheral artery disease) (HCC) 02/28/2016  . Hyperlipidemia 02/28/2016  . 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 Secondary hypertension 02/28/2016     Past Medical History:  Diagnosis Date  . Diabetes mellitus without complication (HCC)   . Hyperlipidemia   . Hypertension   . Peripheral arterial disease [facility])      Past Surgical History:  Procedure Laterality Date  . CARDIAC SURGERY     triple bypass  . FEMORAL-FEMORAL BYPASS GRAFT    . JOINT REPLACEMENT     right hip  . LEG SURGERY Left    leg bypass    Social History   Socioeconomic History  . Marital status: Married    Spouse name: Not on file  . Number of children: Not on file  . Years of education: Not on file  . Highest education level: Not on file  Occupational History  . Not on file  Social Needs  . Financial resource strain: Not  on file  . Food insecurity:    Worry: Not on file    Inability: Not on file  . Transportation needs:    Medical: Not on file    Non-medical: Not on file  Tobacco Use  . Smoking status: Former Smoker    Types: Cigarettes  . Smokeless tobacco: Never Used  Substance and Sexual Activity   . Alcohol use: No  . Drug use: No  . Sexual activity: Not on file  Lifestyle  . Physical activity:    Days per week: Not on file    Minutes per session: Not on file  . Stress: Not on file  Relationships  . Social connections:    Talks on phone: Not on file    Gets together: Not on file    Attends religious service: Not on file    Active member of club or organization: Not on file    Attends meetings of clubs or organizations: Not on file    Relationship status: Not on file  . Intimate partner violence:    Fear of current or ex partner: Not on file    Emotionally abused: Not on file    Physically abused: Not on file    Forced sexual activity: Not on file  Other Topics Concern  . Not on file  Social History Narrative  . Not on file     Family History  Problem Relation Age of Onset  . Diabetes Mother      Current Facility-Administered Medications:  .  0.9 %  sodium chloride infusion, , Intravenous, Continuous, Salary, Montell D, MD, Last Rate: 75 mL/hr at 07/31/17 1300 .  aspirin EC tablet 81 mg, 81 mg, Oral, Daily, Sainani, Vivek J, MD .  ezetimibe (ZETIA) tablet 10 mg, 10 mg, Oral, Daily, Sainani, Vivek J, MD .  insulin aspart (novoLOG) injection 0-5 Units, 0-5 Units, Subcutaneous, QHS, Houston Siren, MD, 2 Units at 07/30/17 2216 .  insulin aspart (novoLOG) injection 0-9 Units, 0-9 Units, Subcutaneous, TID WC, Houston Siren, MD, 3 Units at 07/31/17 1212 .  metoprolol succinate (TOPROL-XL) 24 hr tablet 50 mg, 50 mg, Oral, Daily, Sainani, Vivek J, MD .  ondansetron (ZOFRAN) tablet 4 mg, 4 mg, Oral, Q6H PRN **OR** ondansetron (ZOFRAN) injection 4 mg, 4 mg, Intravenous, Q6H PRN, Sainani, Rolly Pancake, MD .  patiromer (VELTASSA) packet 8.4 g, 8.4 g, Oral, Daily, Houston Siren, MD   Physical exam:  Vitals:   07/30/17 1527 07/30/17 1530 07/30/17 2144 07/31/17 1404  BP: (!) 164/86  (!) 138/91 136/70  Pulse: 87  93 87  Resp: 16  20 20   Temp: 98 F (36.7 C)  (!) 96.7 F  (35.9 C) 97.8 F (36.6 C)  TempSrc: Oral  Axillary Axillary  SpO2: 100%  97% 100%  Weight:  156 lb 8.4 oz (71 kg)    Height:  5\' 4"  (1.626 m)     Physical Exam  Constitutional: She appears well-developed and well-nourished.  HENT:  Head: Normocephalic and atraumatic.  Eyes: Pupils are equal, round, and reactive to light. EOM are normal.  Neck: Normal range of motion.  Cardiovascular: Normal rate, regular rhythm and normal heart sounds.  Pulmonary/Chest: Effort normal and breath sounds normal.  Abdominal: Soft. Bowel sounds are normal.  Musculoskeletal: She exhibits no edema.  B/l SCD's in place  Neurological: She is alert.  Oriented to self and place  Skin: Skin is warm and dry.       CMP Latest Ref  Rng & Units 07/31/2017  Glucose 65 - 99 mg/dL 161(W)  BUN 6 - 20 mg/dL 96(E)  Creatinine 4.54 - 1.00 mg/dL 0.98(J)  Sodium 191 - 478 mmol/L 130(L)  Potassium 3.5 - 5.1 mmol/L 5.5(H)  Chloride 101 - 111 mmol/L 93(L)  CO2 22 - 32 mmol/L 22  Calcium 8.9 - 10.3 mg/dL 9.3  Total Protein 6.5 - 8.1 g/dL 6.0(L)  Total Bilirubin 0.3 - 1.2 mg/dL 3.9(H)  Alkaline Phos 38 - 126 U/L 162(H)  AST 15 - 41 U/L 878(H)  ALT 14 - 54 U/L 1,004(H)   CBC Latest Ref Rng & Units 07/31/2017  WBC 3.6 - 11.0 K/uL 10.0  Hemoglobin 12.0 - 16.0 g/dL 10.9(L)  Hematocrit 35.0 - 47.0 % 33.4(L)  Platelets 150 - 440 K/uL 72(L)    @IMAGES @  Ct Head Wo Contrast  Result Date: 07/14/2017 CLINICAL DATA:  Altered mental status.  Status post CPR. EXAM: CT HEAD WITHOUT CONTRAST TECHNIQUE: Contiguous axial images were obtained from the base of the skull through the vertex without intravenous contrast. COMPARISON:  None. FINDINGS: Brain: Diffusely enlarged ventricles and subarachnoid spaces. Patchy white matter low density in both cerebral hemispheres. Old right occipital and left posterior parietal infarcts. Small, old bilateral cerebellar hemisphere infarcts. No intracranial hemorrhage, mass lesion or CT evidence  of acute infarction. Vascular: No hyperdense vessel or unexpected calcification. Skull: Small right frontal bone exostosis/calcified cephalohematoma. Otherwise, unremarkable skull. Sinuses/Orbits: Unremarkable. Other: None. IMPRESSION: 1. No acute abnormality. 2. Moderate diffuse cerebral and cerebellar atrophy. 3. Moderate chronic small vessel white matter ischemic changes in both cerebral hemispheres. 4. Old infarcts, as described above. Electronically Signed   By: Beckie Salts M.D.   On: 07/14/2017 18:31   Mr Brain Wo Contrast  Result Date: 07/31/2017 CLINICAL DATA:  Altered mental status EXAM: MRI HEAD WITHOUT CONTRAST TECHNIQUE: Multiplanar, multiecho pulse sequences of the brain and surrounding structures were obtained without intravenous contrast. COMPARISON:  Head CT 07/14/2017 FINDINGS: The study is degraded by motion, despite efforts to reduce this artifact, including utilization of motion-resistant MR sequences. The findings of the study are interpreted in the context of reduced sensitivity/specificity. BRAIN: There is no acute infarct, acute hemorrhage or mass effect. The midline structures are normal. 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 Multifocal white matter hyperintensity, most commonly due to chronic ischemic microangiopathy. Generalized atrophy without lobar predilection. Susceptibility-sensitive sequences show no chronic microhemorrhage or superficial siderosis. VASCULAR: Major intracranial arterial and venous sinus flow voids are preserved. SKULL AND UPPER CERVICAL SPINE: The visualized skull base, calvarium, upper cervical spine and extracranial soft tissues are normal. SINUSES/ORBITS: No fluid levels or advanced mucosal thickening. No mastoid or middle ear effusion. The orbits are normal. IMPRESSION: Severely motion degraded examination without acute intracranial abnormality. 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 Electronically Signed   By:  Deatra Robinson M.D.   On: 07/31/2017 03:04   US Renal  Result Date: 07/30/2017 CLINICAL DATA:  Acute renal failure. EXAM: RENAL / URINARY TRACT ULTRASOUND COMPLETE COMPARISON:  None. FINDINGS: Right Kidney: Length: 10.6 cm. Echogenicity within normal limits. No mass or hydronephrosis visualized. Left Kidney: Length: 9.1 cm. Echogenicity within normal limits. A simple appearing cyst is seen in the upper pole measuring 5.0 cm. No mass or hydronephrosis visualized. Bladder: Appears normal for degree of bladder distention. Other: Multiple gallstones incidentally noted. IMPRESSION: No evidence of hydronephrosis or other acute findings. Cholelithiasis incidentally noted. Electronically Signed   By: Myles Rosenthal M.D.   On: 07/30/2017  18:33   US Carotid Bilateral  Result Date: 07/15/2017 CLINICAL DATA:  Syncope. Hypertension, coronary artery disease, hyperlipidemia, diabetes. EXAM: BILATERAL CAROTID DUPLEX ULTRASOUND TECHNIQUE: Wallace Cullens scale imaging, color Doppler and duplex ultrasound was performed of bilateral carotid and vertebral arteries in the neck. COMPARISON:  None. TECHNIQUE: Quantification of carotid stenosis is based on velocity parameters that correlate the residual internal carotid diameter with NASCET-based stenosis levels, using the diameter of the distal internal carotid lumen as the denominator for stenosis measurement. The following velocity measurements were obtained: PEAK SYSTOLIC/END DIASTOLIC RIGHT ICA:                     64/8cm/sec CCA:                     62/5cm/sec SYSTOLIC ICA/CCA RATIO:  0.9 ECA:                     110cm/sec LEFT ICA:                     81/12cm/sec CCA:                     54/6cm/sec SYSTOLIC ICA/CCA RATIO:  1 ECA:                     160cm/sec FINDINGS: RIGHT CAROTID ARTERY: Mild irregular plaque in the distal common carotid artery and bulb, proximal internal and external carotid arteries. No high-grade stenosis. Normal waveforms and color Doppler signal. RIGHT VERTEBRAL  ARTERY:  Normal flow direction and waveform. LEFT CAROTID ARTERY: Mild irregular plaque through the length of the common carotid artery, in the bulb and proximal ICA. No high-grade stenosis. Normal waveforms and color Doppler signal. LEFT VERTEBRAL ARTERY: Normal flow direction and waveform. IMPRESSION: 1. Extensive nonocclusive bilateral carotid plaque resulting in less than 50% diameter stenosis. 2.  Antegrade bilateral vertebral arterial flow. Electronically Signed   By: Corlis Leak M.D.   On: 07/15/2017 10:51   Dg Chest Port 1 View  Result Date: 07/30/2017 CLINICAL DATA:  84 year old female with altered mental status, lethargy. Acute on chronic renal failure, thrombocytopenia, abnormal LFTs. EXAM: PORTABLE CHEST 1 VIEW COMPARISON:  07/14/2017 portable chest and earlier. FINDINGS: Portable AP upright view at 1546 hours. Continued low lung volumes. Stable cardiomegaly and mediastinal contours. Prior CABG. New veiling opacity at the right lateral lung base, although most resembles a small pleural effusion. No pneumothorax or pulmonary edema. The left lung base appears clear. Negative visible bowel gas pattern. Stable visualized osseous structures. IMPRESSION: 1. New veiling opacity at the right lung base, but more resembles a small pleural effusion than pneumonia. 2. Otherwise stable chest: Chronic low lung volumes and cardiomegaly. Electronically Signed   By: Odessa Fleming M.D.   On: 07/30/2017 16:07   Dg Chest Port 1 View  Result Date: 07/14/2017 CLINICAL DATA:  Status post CPR. EXAM: PORTABLE CHEST 1 VIEW COMPARISON:  10/10/2009. FINDINGS: Poor inspiration. Minimal patchy opacity at both medial lung bases. Otherwise, clear lungs. Stable post CABG changes. Thoracic spine degenerative changes. Stable mildly enlarged cardiac silhouette. IMPRESSION: Poor inspiration with minimal patchy bibasilar atelectasis or aspiration pneumonitis. Electronically Signed   By: Beckie Salts M.D.   On: 07/14/2017 18:26   Dg Ugi   W/kub  Result Date: 07/31/2017 CLINICAL DATA:  Dysphagia. EXAM: UPPER GI SERIES WITH KUB TECHNIQUE: After obtaining a scout radiograph a routine upper GI series was performed using  thin barium. FLUOROSCOPY TIME:  Fluoroscopy Time:  1 minutes 42 seconds Radiation Exposure Index (if provided by the fluoroscopic device): 27.6 mGy Number of Acquired Spot Images: 3 COMPARISON:  Ultrasound 07/30/2017. FINDINGS: Calcific densities noted right upper quadrant consistent with known gallstones. Pelvic calcifications consistent fibroids. Aortoiliac atherosclerotic vascular calcifications. Degenerative changes thoracolumbar spine. Total right hip replacement. This was a limited study as patient had difficulty swallowing. No evidence of esophageal obstruction. The stomach could not be evaluated due to the small amount of barium swallowed. IMPRESSION: Limited exam as the patient had difficulty swallowing. No evidence of esophageal obstruction. The stomach could not be evaluated due to the small amount of barium swallowed. Electronically Signed   By: Maisie Fus  Register   On: 07/31/2017 09:27   US Abdomen Limited Ruq  Result Date: 07/31/2017 CLINICAL DATA:  Abnormal LFTs EXAM: ULTRASOUND ABDOMEN LIMITED RIGHT UPPER QUADRANT COMPARISON:  None. FINDINGS: Gallbladder: Multiple calculi are identified within the gallbladder. Gallbladder wall is not thickened. No pericholecystic fluid is seen. Common bile duct: Diameter: 3.6 mm. Liver: Diffuse increased echogenicity is noted without focal mass. Portal vein is patent on color Doppler imaging with normal direction of blood flow towards the liver. Incidental note is made of a small right pleural effusion. Minimal ascites is also noted. IMPRESSION: Cholelithiasis without complicating factors. Changes most consistent with fatty infiltration of the liver. Small right pleural effusion and minimal ascites. Electronically Signed   By: Alcide Clever M.D.   On: 07/31/2017 09:39    Assessment  and plan- Patient is a 84 y.o. female admitted for lethargy and weakness as well as elevated LFTs and thrombocytopenia  1.  Thrombocytopenia: Based on her recent discharge summary in May 2019 patient was not discharged on any new medications that would cause thrombocytopenia.  Patient's platelet count was normal on 07/14/2017 at 169 and over the past 2 days it has been 85 and 72.Peripheral smear review does not reveal significant schistocytes suggestive of a microangiopathic hemolytic process. LDH can be elevated in acute liver injury and is therefore nonspecific for TTP.  I do not suspect that patient has TTP at this point. We would also expect to see significant thrombocytopenia in TTP typically.  I will check PT PTT INR fibrinogen today to rule out DIC. Will also check B12 and folate.  Will repeat another smear review tomorrow.  If there is no significant decline in her platelet count over the next 2 days I will follow this as an outpatient as this could be seen in the setting of acute illness.   2. She does have significantly elevated LFTs and I would recommend getting GI consult is LFT's dont improve by tomorrow. RUQ USG showed fatty liver. However, acute rise in the LFT's suspicious of congestive hepatic disease. Consider getting a repet echo. Prior echo did not visualize the right ventricle  3. Microcytic anemia: microcytosis from sickle cell trait. Hb electropheresis showed 6A: 34S back in 1997. Continue to monitor   Thank you for this kind referral and the opportunity to participate in the care of this patient   Visit Diagnosis 1. Abnormal LFTs   2. Dysphagia   3. Shortness of breath   4. ARF (acute renal failure) (HCC)     Dr. Owens Shark, MD, MPH [facility] at [facility] [phone] 07/31/2017  4:37 PM

## 2017-07-31 NOTE — Progress Notes (Signed)
RN noticed right arm swelling when pt got back from upper endo. Elevated on two pillows and mentioned to Dr. Katheren Shams who also assessed. Keep elevated and apply warm compresses. Will continue to monitor

## 2017-07-31 NOTE — Progress Notes (Signed)
Notified prime doc of critical lab value for fibrinigin of 63. No new orders at this time

## 2017-07-31 NOTE — Progress Notes (Signed)
[employer] - [place] at [facility]   PATIENT NAME: Elizabeth [name]    MR#:  [id number]  DATE OF BIRTH:  1929-08-01  SUBJECTIVE:  CHIEF COMPLAINT:  No chief complaint on file.  Patient without complaint, no events overnight per nursing staff, oncology to see REVIEW OF SYSTEMS:  CONSTITUTIONAL: No fever, fatigue or weakness.  EYES: No blurred or double vision.  EARS, NOSE, AND THROAT: No tinnitus or ear pain.  RESPIRATORY: No cough, shortness of breath, wheezing or hemoptysis.  CARDIOVASCULAR: No chest pain, orthopnea, edema.  GASTROINTESTINAL: No nausea, vomiting, diarrhea or abdominal pain.  GENITOURINARY: No dysuria, hematuria.  ENDOCRINE: No polyuria, nocturia,  HEMATOLOGY: No anemia, easy bruising or bleeding SKIN: No rash or lesion. MUSCULOSKELETAL: No joint pain or arthritis.   NEUROLOGIC: No tingling, numbness, weakness.  PSYCHIATRY: No anxiety or depression.   ROS  DRUG ALLERGIES:   Allergies  Allergen Reactions  . Meperidine Nausea Only    VITALS:  Blood pressure (!) 138/91, pulse 93, temperature (!) 96.7 F (35.9 C), temperature source Axillary, resp. rate 20, height 5\' 4"  (1.626 m), weight 71 kg (156 lb 8.4 oz), SpO2 97 %.  PHYSICAL EXAMINATION:  GENERAL:  84 y.o.-year-old patient lying in the bed with no acute distress.  EYES: Pupils equal, round, reactive to light and accommodation. No scleral icterus. Extraocular muscles intact.  HEENT: Head atraumatic, normocephalic. Oropharynx and nasopharynx clear.  NECK:  Supple, no jugular venous distention. No thyroid enlargement, no tenderness.  LUNGS: Normal breath sounds bilaterally, no wheezing, rales,rhonchi or crepitation. No use of accessory muscles of respiration.  CARDIOVASCULAR: S1, S2 normal. No murmurs, rubs, or gallops.  ABDOMEN: Soft, nontender, nondistended. Bowel sounds present. No organomegaly or mass.  EXTREMITIES: No pedal edema, cyanosis, or clubbing.  NEUROLOGIC: Cranial nerves II  through XII are intact. Muscle strength 5/5 in all extremities. Sensation intact. Gait not checked.  PSYCHIATRIC: The patient is alert and oriented x 3.  SKIN: No obvious rash, lesion, or ulcer.   Physical Exam LABORATORY PANEL:   CBC Recent Labs  Lab 07/31/17 0645  WBC 10.0  HGB 10.9*  HCT 33.4*  PLT 72*   ------------------------------------------------------------------------------------------------------------------  Chemistries  Recent Labs  Lab 07/31/17 0645  NA 130*  K 5.5*  CL 93*  CO2 22  GLUCOSE 274*  BUN 99*  CREATININE 3.58*  CALCIUM 9.3  AST 878*  ALT 1,004*  ALKPHOS 162*  BILITOT 3.9*   ------------------------------------------------------------------------------------------------------------------  Cardiac Enzymes Recent Labs  Lab 07/31/17 0052 07/31/17 0645  TROPONINI 0.30* 0.33*   ------------------------------------------------------------------------------------------------------------------  RADIOLOGY:  Mr Brain Wo Contrast  Result Date: 07/31/2017 CLINICAL DATA:  Altered mental status EXAM: MRI HEAD WITHOUT CONTRAST TECHNIQUE: Multiplanar, multiecho pulse sequences of the brain and surrounding structures were obtained without intravenous contrast. COMPARISON:  Head CT 07/14/2017 FINDINGS: The study is degraded by motion, despite efforts to reduce this artifact, including utilization of motion-resistant MR sequences. The findings of the study are interpreted in the context of reduced sensitivity/specificity. BRAIN: There is no acute infarct, acute hemorrhage or mass effect. The midline structures are normal. 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 Multifocal white matter hyperintensity, most commonly due to chronic ischemic microangiopathy. Generalized atrophy without lobar predilection. Susceptibility-sensitive sequences show no chronic microhemorrhage or superficial siderosis. VASCULAR: Major intracranial arterial and venous sinus flow voids are  preserved. SKULL AND UPPER CERVICAL SPINE: The visualized skull base, calvarium, upper cervical spine and extracranial soft tissues are normal. SINUSES/ORBITS: No fluid levels or advanced mucosal thickening. No mastoid  or middle ear effusion. The orbits are normal. IMPRESSION: Severely motion degraded examination without acute intracranial abnormality. 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 Electronically Signed   By: Deatra Robinson M.D.   On: 07/31/2017 03:04   US Renal  Result Date: 07/30/2017 CLINICAL DATA:  Acute renal failure. EXAM: RENAL / URINARY TRACT ULTRASOUND COMPLETE COMPARISON:  None. FINDINGS: Right Kidney: Length: 10.6 cm. Echogenicity within normal limits. No mass or hydronephrosis visualized. Left Kidney: Length: 9.1 cm. Echogenicity within normal limits. A simple appearing cyst is seen in the upper pole measuring 5.0 cm. No mass or hydronephrosis visualized. Bladder: Appears normal for degree of bladder distention. Other: Multiple gallstones incidentally noted. IMPRESSION: No evidence of hydronephrosis or other acute findings. Cholelithiasis incidentally noted. Electronically Signed   By: Myles Rosenthal M.D.   On: 07/30/2017 18:33   Dg Chest Port 1 View  Result Date: 07/30/2017 CLINICAL DATA:  84 year old female with altered mental status, lethargy. Acute on chronic renal failure, thrombocytopenia, abnormal LFTs. EXAM: PORTABLE CHEST 1 VIEW COMPARISON:  07/14/2017 portable chest and earlier. FINDINGS: Portable AP upright view at 1546 hours. Continued low lung volumes. Stable cardiomegaly and mediastinal contours. Prior CABG. New veiling opacity at the right lateral lung base, although most resembles a small pleural effusion. No pneumothorax or pulmonary edema. The left lung base appears clear. Negative visible bowel gas pattern. Stable visualized osseous structures. IMPRESSION: 1. New veiling opacity at the right lung base, but more resembles a  small pleural effusion than pneumonia. 2. Otherwise stable chest: Chronic low lung volumes and cardiomegaly. Electronically Signed   By: Odessa Fleming M.D.   On: 07/30/2017 16:07   Dg Kayleen Memos  W/kub  Result Date: 07/31/2017 CLINICAL DATA:  Dysphagia. EXAM: UPPER GI SERIES WITH KUB TECHNIQUE: After obtaining a scout radiograph a routine upper GI series was performed using thin barium. FLUOROSCOPY TIME:  Fluoroscopy Time:  1 minutes 42 seconds Radiation Exposure Index (if provided by the fluoroscopic device): 27.6 mGy Number of Acquired Spot Images: 3 COMPARISON:  Ultrasound 07/30/2017. FINDINGS: Calcific densities noted right upper quadrant consistent with known gallstones. Pelvic calcifications consistent fibroids. Aortoiliac atherosclerotic vascular calcifications. Degenerative changes thoracolumbar spine. Total right hip replacement. This was a limited study as patient had difficulty swallowing. No evidence of esophageal obstruction. The stomach could not be evaluated due to the small amount of barium swallowed. IMPRESSION: Limited exam as the patient had difficulty swallowing. No evidence of esophageal obstruction. The stomach could not be evaluated due to the small amount of barium swallowed. Electronically Signed   By: Maisie Fus  Register   On: 07/31/2017 09:27   US Abdomen Limited Ruq  Result Date: 07/31/2017 CLINICAL DATA:  Abnormal LFTs EXAM: ULTRASOUND ABDOMEN LIMITED RIGHT UPPER QUADRANT COMPARISON:  None. FINDINGS: Gallbladder: Multiple calculi are identified within the gallbladder. Gallbladder wall is not thickened. No pericholecystic fluid is seen. Common bile duct: Diameter: 3.6 mm. Liver: Diffuse increased echogenicity is noted without focal mass. Portal vein is patent on color Doppler imaging with normal direction of blood flow towards the liver. Incidental note is made of a small right pleural effusion. Minimal ascites is also noted. IMPRESSION: Cholelithiasis without complicating factors. Changes most  consistent with fatty infiltration of the liver. Small right pleural effusion and minimal ascites. Electronically Signed   By: Alcide Clever M.D.   On: 07/31/2017 09:39    ASSESSMENT AND PLAN:  84 year old female with past medical history of diabetes, hypertension, peripheral artery disease, recent admission for  hyponatremia, chronic kidney disease stage III who presents to the hospital from direct admission due to altered mental status/encephalopathy and acute on chronic renal failure.  *Acute toxic metabolic encephalopathy  Resolved  Most likely secondary to acute on chronic renal failure, hyponatremia, and acute transaminitis  * AKI w/ CKD IV Nephrology input appreciated Baseline creatinine 2 Avoid nephrotoxic agents, avoid Januvia use going forward, strict I&O monitoring, daily weights, renal ultrasound was unimpressive, IV fluids for rehydration, CMP in the morning  *Acute hyperkalemia Resolving Continue [place], CMP in the morning  *Acute transaminitis  Most likely secondary to fatty liver  Liver ultrasound noted for gallstones without pathology/complication  Avoid hepatotoxic agents, CMP in the morning   *Acute thrombocytopenia  Continue worsening noted  Exact etiology unknown, ? ITP/TTP  Abdominal ultrasound noted above  Oncology to see  Continue to avoid anticoagulants   *Chronic diabetes mellitus type 2  Sliding scale insulin with Accu-Cheks per routine discontinue Januvia and glipizide indefinitely given acute kidney injury with worsening chronic kidney disease stage IV  *Chronic hyperlipidemia, unspecified  Stable  Continue Zetia   *Chronic benign essential hypertension  Stable on Toprol    All the records are reviewed and case discussed with Care Management/Social Workerr. Management plans discussed with the patient, family and they are in agreement.  CODE STATUS: full  TOTAL TIME TAKING CARE OF THIS PATIENT: 35 minutes.     POSSIBLE D/C IN -13 DAYS,  DEPENDING ON CLINICAL CONDITION.   Evelena Asa Chiyoko Torrico M.D on 07/31/2017   Between 7am to 6pm - Pager - [phone]  After 6pm go to [email or web address] - password [email or web address]  [facility]  Office  [phone]  CC: Primary care physician; [name], MD  Note: This dictation was prepared with Dragon dictation along with smaller phrase technology. Any transcriptional errors that result from this process are unintentional.

## 2017-07-31 NOTE — Progress Notes (Addendum)
SLP Cancellation Note  Patient Details Name: Elizabeth [name] MRN: [id number] DOB: 10-12-1929   Cancelled treatment:       Reason Eval/Treat Not Completed: Patient not medically ready;Medical issues which prohibited therapy(chart reviewed; consulted NSG re: pt's status) Per chart review, pt has elevated Troponin and Lactic Acid, low Sodium. Pt also has a baseline of Dementia and poor swallowing just prior to admission. She arouses only to mod. stimulation currently. Pt is NPO. Pt does not appear appropriate or safe for BSE/oral intake currently. Recommend frequent oral care for hygiene and stimulation of swallowing when awake - encouraged NSG to work w/ family on this as well. Recommend medications via IV; NSG to discuss w/ MD. ST services will f/u tomorrow for assessment of status and appropriateness for BSE.     Jerilynn Som, MS, CCC-SLP Normalee Sistare 07/31/2017, 9:43 AM

## 2017-07-31 NOTE — Progress Notes (Signed)
Inpatient Diabetes Program Recommendations  AACE/ADA: New Consensus Statement on Inpatient Glycemic Control (2015)  Target Ranges:  Prepandial:   less than 140 mg/dL      Peak postprandial:   less than 180 mg/dL (1-2 hours)      Critically ill patients:  140 - 180 mg/dL  Results for TEKIYAH, RIZO (MRN [id number]) as of 07/31/2017 08:28  Ref. Range 07/30/2017 16:50 07/30/2017 21:46 07/31/2017 07:39  Glucose-Capillary Latest Ref Range: 65 - 99 mg/dL 423 (H)  Novolog 5 units 219 (H)  Novolog 2 units 261 (H)  Novolog 5 units   Results for CAROLLYNN, CASSLER (MRN [id number]) as of 07/31/2017 08:28  Ref. Range 07/15/2017 07:21 07/15/2017 11:24 07/15/2017 16:15 07/15/2017 21:46 07/16/2017 07:36 07/16/2017 11:27  Glucose-Capillary Latest Ref Range: 65 - 99 mg/dL 79  Glipizide 5 mg @ 3:56 123 (H)  Novolog 1 unit 88 109 (H) 68 189 (H)  Novolog 2 units  Results for TAALIYAH, PASCIAK (MRN [id number]) as of 07/31/2017 08:28  Ref. Range 07/14/2017 22:59  Hemoglobin A1C Latest Ref Range: 4.8 - 5.6 % 6.4 (H)   Review of Glycemic Control  Diabetes history: DM2 Outpatient Diabetes medications: Januvia 25 mg daily Current orders for Inpatient glycemic control: Novolog 0-9 units TID with meals, Novolog 0-5 units QHS  Inpatient Diabetes Program Recommendations:   Insulin-Correction: If patient remains NPO, please consider changing CBGs and Novolog to 0-9 units Q4H.  Oral Agents: Noted Januvia 25 mg daily on home med list which appears to have been started on 07/16/17 at last hospital discharge (Glipizide 5 mg daily was discontinued and Januvia 25 mg daily was started). Due to actue renal failure, recommend discontinuing Januvia as an outpatient and order Glipizide 2.5 mg daily as an outpatient. Once diet is resumed, may also want to consider ordering Glipizide 2.5 mg daily as an inpatient.  NOTE: In reviewing chart, noted patient was inpatient from 07/14/17 to 07/16/17 and at time of discharge Glipizide 5 mg daily  was discontinued and patient was started on Januvia 25 mg daily. While inpatient during that hospitalization patient received Glipizide 5 mg and glucose ranged from 68-189 mg/dl. Would recommend stopping Januvia (excreted by kidneys) as an outpatient and go back to Glipizide but a lower dose such as 2.5 mg daily. Since patient has been admitted this admission on 07/30/17, glucose has ranged from 219-261 mg/dl with Novolog sensitive correction scale. Once diet is resumed, MD may want to consider order Glipizide 2.5 mg daily as an inpatient (along with Novolog correction scale).  Thanks, Orlando Penner, RN, MSN, CDE Diabetes Coordinator Inpatient Diabetes Program [phone] (Team Pager from 8am to 5pm)

## 2017-07-31 NOTE — Care Management (Signed)
Patient readmit.  Admitted for AMS.  Patient lives at home with husband.  PCP Letitia Libra.  RW and cane in the home.  On 5/27 referral was made to [facility].  Per Barbara Cower with [facility] they were able to reach patient after discharge, however she did not allow visits and stated she would call back when she was ready.  Patient is currently NPO.  ST to follow up tomorrow for assessment.  RNCM following

## 2017-07-31 NOTE — Progress Notes (Signed)
[facility] [place], [place] 07/31/17  Subjective:   Patient remains critically ill.  She is disoriented.  Still very lethargic Lab results from this morning show slight improvement in creatinine to 3.58, potassium of 5.5.  Bicarbonate has improved to 22.  LFTs are worse.  Objective:  Vital signs in last 24 hours:  Temp:  [96.7 F (35.9 C)-98 F (36.7 C)] 96.7 F (35.9 C) (06/12 2144) Pulse Rate:  [87-93] 93 (06/12 2144) Resp:  [16-20] 20 (06/12 2144) BP: (138-164)/(86-91) 138/91 (06/12 2144) SpO2:  [97 %-100 %] 97 % (06/12 2144) Weight:  [71 kg (156 lb 8.4 oz)] 71 kg (156 lb 8.4 oz) (06/12 1530)  Weight change:  Filed Weights   07/30/17 1530  Weight: 71 kg (156 lb 8.4 oz)    Intake/Output:    Intake/Output Summary (Last 24 hours) at 07/31/2017 1057 Last data filed at 07/30/2017 2144 Gross per 24 hour  Intake -  Output 0 ml  Net 0 ml     Physical Exam: General:  Ill-appearing, laying in the bed  HEENT  moist oral mucous membranes  Neck  supple  Pulm/lungs  normal breathing effort, mild basilar crackles  CVS/Heart  irregular, no rub  Abdomen:   Soft, nontender  Extremities:  No significant edema  Neurologic:  Alert, able to follow commands, oriented to self and family  Skin:  No acute rashes          Basic Metabolic Panel:  Recent Labs  Lab 07/30/17 1613 07/31/17 0645  NA  --  130*  K  --  5.5*  CL  --  93*  CO2  --  22  GLUCOSE  --  274*  BUN  --  99*  CREATININE 3.95* 3.58*  CALCIUM  --  9.3     CBC: Recent Labs  Lab 07/30/17 1613 07/31/17 0645  WBC 9.8 10.0  HGB 12.5 10.9*  HCT 37.9 33.4*  MCV 77.8* 76.6*  PLT 85* 72*     No results found for: HEPBSAG, HEPBSAB, HEPBIGM    Microbiology:  No results found for this or any previous visit (from the past 240 hour(s)).  Coagulation Studies: No results for input(s): LABPROT, INR in the last 72 hours.  Urinalysis: No results for input(s): COLORURINE, LABSPEC,  PHURINE, GLUCOSEU, HGBUR, BILIRUBINUR, KETONESUR, PROTEINUR, UROBILINOGEN, NITRITE, LEUKOCYTESUR in the last 72 hours.  Invalid input(s): APPERANCEUR    Imaging: Mr Brain Wo Contrast  Result Date: 07/31/2017 CLINICAL DATA:  Altered mental status EXAM: MRI HEAD WITHOUT CONTRAST TECHNIQUE: Multiplanar, multiecho pulse sequences of the brain and surrounding structures were obtained without intravenous contrast. COMPARISON:  Head CT 07/14/2017 FINDINGS: The study is degraded by motion, despite efforts to reduce this artifact, including utilization of motion-resistant MR sequences. The findings of the study are interpreted in the context of reduced sensitivity/specificity. BRAIN: There is no acute infarct, acute hemorrhage or mass effect. The midline structures are normal. 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 Multifocal white matter hyperintensity, most commonly due to chronic ischemic microangiopathy. Generalized atrophy without lobar predilection. Susceptibility-sensitive sequences show no chronic microhemorrhage or superficial siderosis. VASCULAR: Major intracranial arterial and venous sinus flow voids are preserved. SKULL AND UPPER CERVICAL SPINE: The visualized skull base, calvarium, upper cervical spine and extracranial soft tissues are normal. SINUSES/ORBITS: No fluid levels or advanced mucosal thickening. No mastoid or middle ear effusion. The orbits are normal. IMPRESSION: Severely motion degraded examination without acute intracranial abnormality. 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 Electronically Signed   By: Deatra Robinson M.D.   On: 07/31/2017 03:04   US Renal  Result Date: 07/30/2017 CLINICAL DATA:  Acute renal failure. EXAM: RENAL / URINARY TRACT ULTRASOUND COMPLETE COMPARISON:  None. FINDINGS: Right Kidney: Length: 10.6 cm. Echogenicity within normal limits. No mass or hydronephrosis visualized. Left Kidney: Length: 9.1 cm. Echogenicity within  normal limits. A simple appearing cyst is seen in the upper pole measuring 5.0 cm. No mass or hydronephrosis visualized. Bladder: Appears normal for degree of bladder distention. Other: Multiple gallstones incidentally noted. IMPRESSION: No evidence of hydronephrosis or other acute findings. Cholelithiasis incidentally noted. Electronically Signed   By: Myles Rosenthal M.D.   On: 07/30/2017 18:33   Dg Chest Port 1 View  Result Date: 07/30/2017 CLINICAL DATA:  84 year old female with altered mental status, lethargy. Acute on chronic renal failure, thrombocytopenia, abnormal LFTs. EXAM: PORTABLE CHEST 1 VIEW COMPARISON:  07/14/2017 portable chest and earlier. FINDINGS: Portable AP upright view at 1546 hours. Continued low lung volumes. Stable cardiomegaly and mediastinal contours. Prior CABG. New veiling opacity at the right lateral lung base, although most resembles a small pleural effusion. No pneumothorax or pulmonary edema. The left lung base appears clear. Negative visible bowel gas pattern. Stable visualized osseous structures. IMPRESSION: 1. New veiling opacity at the right lung base, but more resembles a small pleural effusion than pneumonia. 2. Otherwise stable chest: Chronic low lung volumes and cardiomegaly. Electronically Signed   By: Odessa Fleming M.D.   On: 07/30/2017 16:07   Dg Kayleen Memos  W/kub  Result Date: 07/31/2017 CLINICAL DATA:  Dysphagia. EXAM: UPPER GI SERIES WITH KUB TECHNIQUE: After obtaining a scout radiograph a routine upper GI series was performed using thin barium. FLUOROSCOPY TIME:  Fluoroscopy Time:  1 minutes 42 seconds Radiation Exposure Index (if provided by the fluoroscopic device): 27.6 mGy Number of Acquired Spot Images: 3 COMPARISON:  Ultrasound 07/30/2017. FINDINGS: Calcific densities noted right upper quadrant consistent with known gallstones. Pelvic calcifications consistent fibroids. Aortoiliac atherosclerotic vascular calcifications. Degenerative changes thoracolumbar spine. Total  right hip replacement. This was a limited study as patient had difficulty swallowing. No evidence of esophageal obstruction. The stomach could not be evaluated due to the small amount of barium swallowed. IMPRESSION: Limited exam as the patient had difficulty swallowing. No evidence of esophageal obstruction. The stomach could not be evaluated due to the small amount of barium swallowed. Electronically Signed   By: Maisie Fus  Register   On: 07/31/2017 09:27   US Abdomen Limited Ruq  Result Date: 07/31/2017 CLINICAL DATA:  Abnormal LFTs EXAM: ULTRASOUND ABDOMEN LIMITED RIGHT UPPER QUADRANT COMPARISON:  None. FINDINGS: Gallbladder: Multiple calculi are identified within the gallbladder. Gallbladder wall is not thickened. No pericholecystic fluid is seen. Common bile duct: Diameter: 3.6 mm. Liver: Diffuse increased echogenicity is noted without focal mass. Portal vein is patent on color Doppler imaging with normal direction of blood flow towards the liver. Incidental note is made of a small right pleural effusion. Minimal ascites is also noted. IMPRESSION: Cholelithiasis without complicating factors. Changes most consistent with fatty infiltration of the liver. Small right pleural effusion and minimal ascites. Electronically Signed   By: Alcide Clever M.D.   On: 07/31/2017 09:39     Medications:   .  sodium bicarbonate  infusion 1000 mL 100 mL/hr at 07/31/17 0348   . aspirin EC  81 mg Oral Daily  . ezetimibe  10 mg Oral Daily  . insulin aspart  0-5 Units Subcutaneous QHS  .  insulin aspart  0-9 Units Subcutaneous TID WC  . metoprolol succinate  50 mg Oral Daily  . niacin  500 mg Oral QHS  . patiromer  8.4 g Oral Daily   acetaminophen **OR** acetaminophen, ondansetron **OR** ondansetron (ZOFRAN) IV  Assessment/ Plan:  84 y.o. African-American female with  diabetes, hypertension, hyperlipidemia, chronic kidney disease stage IV, peripheral vascular disease, triple-vessel CABG, chronic systolic congestive  heart failure with LVEF 20 to 25% from echo in May 2019, history of femoral bypass, who was admitted to [facility] directly from PMD office on 07/30/2017 for evaluation of acute renal failure and elevated liver enzymes.   1.  Acute kidney injury 2.  Chronic kidney disease stage IV, baseline creatinine 2.17/GFR 23 from 07/16/2017 3.  Severe hyperkalemia 4.  Metabolic acidosis/ Lactic acidosis 5.  Acute hepatitis/transaminitis 6.  Altered mental status 7.  Thrombocytopenia 8.  Chronic systolic congestive heart failure with LVEF 20 to 25% from May 2019  Case discussed with hematologist.  No schistocytes noted on peripheral smear.  Right upper quadrant ultrasound shows cholelithiasis, fatty infiltration of liver.  Renal ultrasound without any evidence of hydronephrosis.  Serum creatinine and potassium level have improved.  Continue conservative management.  No acute indication for hemodialysis at present.     LOS: 1 Elizabeth [name] 6/13/201910:57 AM  [facility] [place], [place] [phone]  Note: This note was prepared with Dragon dictation. Any transcription errors are unintentional

## 2017-08-01 DIAGNOSIS — R945 Abnormal results of liver function studies: Secondary | ICD-10-CM

## 2017-08-01 LAB — GLUCOSE, CAPILLARY
GLUCOSE-CAPILLARY: 181 mg/dL — AB (ref 65–99)
Glucose-Capillary: 164 mg/dL — ABNORMAL HIGH (ref 65–99)
Glucose-Capillary: 171 mg/dL — ABNORMAL HIGH (ref 65–99)
Glucose-Capillary: 167 mg/dL — ABNORMAL HIGH (ref 65–99)

## 2017-08-01 LAB — CBC WITH DIFFERENTIAL/PLATELET
BAND NEUTROPHILS: 0 %
BASOS PCT: 0 %
Basophils Absolute: 0 10*3/uL (ref 0–0.1)
Blasts: 0 %
EOS ABS: 0 10*3/uL (ref 0–0.7)
EOS PCT: 0 %
HCT: 34.3 % — ABNORMAL LOW (ref 35.0–47.0)
HEMOGLOBIN: 11 g/dL — AB (ref 12.0–16.0)
LYMPHS ABS: 0.5 10*3/uL — AB (ref 1.0–3.6)
LYMPHS PCT: 5 %
MCH: 24.8 pg — AB (ref 26.0–34.0)
MCHC: 32.2 g/dL (ref 32.0–36.0)
MCV: 76.8 fL — ABNORMAL LOW (ref 80.0–100.0)
MONO ABS: 0.2 10*3/uL (ref 0.2–0.9)
Metamyelocytes Relative: 0 %
Monocytes Relative: 2 %
Myelocytes: 0 %
Neutro Abs: 9.6 10*3/uL — ABNORMAL HIGH (ref 1.4–6.5)
Neutrophils Relative %: 93 %
OTHER: 0 %
PROMYELOCYTES RELATIVE: 0 %
Platelets: 73 10*3/uL — ABNORMAL LOW (ref 150–440)
RBC: 4.46 MIL/uL (ref 3.80–5.20)
RDW: 14.9 % — AB (ref 11.5–14.5)
WBC: 10.3 10*3/uL (ref 3.6–11.0)
nRBC: 22 /100 WBC — ABNORMAL HIGH

## 2017-08-01 LAB — COMPREHENSIVE METABOLIC PANEL
ALK PHOS: 162 U/L — AB (ref 38–126)
ALT: 1119 U/L — AB (ref 14–54)
ANION GAP: 17 — AB (ref 5–15)
AST: 935 U/L — ABNORMAL HIGH (ref 15–41)
Albumin: 3.7 g/dL (ref 3.5–5.0)
BUN: 106 mg/dL — ABNORMAL HIGH (ref 6–20)
CALCIUM: 9.3 mg/dL (ref 8.9–10.3)
CHLORIDE: 100 mmol/L — AB (ref 101–111)
CO2: 18 mmol/L — AB (ref 22–32)
CREATININE: 3.4 mg/dL — AB (ref 0.44–1.00)
GFR calc Af Amer: 13 mL/min — ABNORMAL LOW (ref >60)
GFR calc non Af Amer: 11 mL/min — ABNORMAL LOW (ref >60)
GLUCOSE: 162 mg/dL — AB (ref 65–99)
Potassium: 5.3 mmol/L — ABNORMAL HIGH (ref 3.5–5.1)
SODIUM: 135 mmol/L (ref 135–145)
Total Bilirubin: 4.2 mg/dL — ABNORMAL HIGH (ref 0.3–1.2)
Total Protein: 5.9 g/dL — ABNORMAL LOW (ref 6.5–8.1)

## 2017-08-01 LAB — PATHOLOGIST SMEAR REVIEW

## 2017-08-01 LAB — AMMONIA: Ammonia: 41 umol/L — ABNORMAL HIGH (ref 9–35)

## 2017-08-01 LAB — VITAMIN B12: Vitamin B-12: 5732 pg/mL — ABNORMAL HIGH (ref 180–914)

## 2017-08-01 LAB — LACTATE DEHYDROGENASE: LDH: 986 U/L — AB (ref 98–192)

## 2017-08-01 MED ORDER — ORAL CARE MOUTH RINSE
15.0000 mL | Freq: Two times a day (BID) | OROMUCOSAL | Status: DC
  Administered 2017-08-01 – 2017-08-05 (×5): 15 mL via OROMUCOSAL

## 2017-08-01 MED ORDER — SODIUM CHLORIDE 0.45 % IV SOLN: INTRAVENOUS | Status: DC

## 2017-08-01 MED ORDER — ENSURE ENLIVE PO LIQD: 237.0000 mL | Freq: Two times a day (BID) | ORAL | Status: DC

## 2017-08-01 MED ORDER — LACTATED RINGERS IV SOLN
INTRAVENOUS | Status: DC
  Administered 2017-08-01: 19:00:00 via INTRAVENOUS

## 2017-08-01 MED ORDER — SODIUM CHLORIDE 0.9 % IV SOLN
INTRAVENOUS | Status: DC
  Administered 2017-08-01: 18:00:00 via INTRAVENOUS

## 2017-08-01 MED ORDER — ADULT MULTIVITAMIN W/MINERALS CH
1.0000 | ORAL_TABLET | Freq: Every day | ORAL | Status: DC
  Administered 2017-08-02: 1 via ORAL
  Filled 2017-08-01: qty 1

## 2017-08-01 NOTE — Progress Notes (Signed)
PT Cancellation Note  Patient Details Name: Elizabeth [name] MRN: [id number] DOB: 09/18/1929   Cancelled Treatment:    Reason Eval/Treat Not Completed: Fatigue/lethargy limiting ability to participate.  Pt was willing to participate and PT attempted to initiate evaluation but pt reported being too fatigued to participate in several attempted tests.  Pt states that she would like to defer evaluation until tomorrow when she has more energy.  Will re-attempt tomorrow if pt is more appropriate.   Glenetta Hew, PT, DPT 08/01/2017, 3:04 PM

## 2017-08-01 NOTE — Progress Notes (Signed)
On call nephrology was notified regarding fluid orders. Verbal order to hang LR at 40 ml/hr. Will continue to monitor pt.   Elizabeth [name] [employer]

## 2017-08-01 NOTE — Progress Notes (Addendum)
Hematology/Oncology Consult note [facility]  [phone] [phone]  Patient Care Team: [name], MD as PCP - General (Internal Medicine)   Name of the patient: Elizabeth [name]  [id number]  1930-01-24   Date of visit: 08/01/2017  Interval history- family reports her throat hurts when she tries to swallow anything. She is feeling tired and does nto verbalize much. Family members reports she can identify them and carry out conversations but is intermittently confused especially at night   Review of systems- Review of Systems  Constitutional: Positive for malaise/fatigue. Negative for chills, fever and weight loss.  HENT: Negative for congestion, ear discharge and nosebleeds.   Eyes: Negative for blurred vision.  Respiratory: Negative for cough, hemoptysis, sputum production, shortness of breath and wheezing.   Cardiovascular: Negative for chest pain, palpitations, orthopnea and claudication.  Gastrointestinal: Positive for nausea. Negative for abdominal pain, blood in stool, constipation, diarrhea, heartburn, melena and vomiting.  Genitourinary: Negative for dysuria, flank pain, frequency, hematuria and urgency.  Musculoskeletal: Negative for back pain, joint pain and myalgias.  Skin: Negative for rash.  Neurological: Negative for dizziness, tingling, focal weakness, seizures, weakness and headaches.  Endo/Heme/Allergies: Does not bruise/bleed easily.  Psychiatric/Behavioral: Negative for depression and suicidal ideas. The patient does not have insomnia.       Allergies  Allergen Reactions  . Meperidine Nausea Only     Past Medical History:  Diagnosis Date  . Diabetes mellitus without complication (HCC)   . Hyperlipidemia   . Hypertension   . Peripheral arterial disease [facility])      Past Surgical History:  Procedure Laterality Date  . CARDIAC SURGERY     triple bypass  . FEMORAL-FEMORAL BYPASS GRAFT    . JOINT  REPLACEMENT     right hip  . LEG SURGERY Left    leg bypass    Social History   Socioeconomic History  . Marital status: Married    Spouse name: Not on file  . Number of children: Not on file  . Years of education: Not on file  . Highest education level: Not on file  Occupational History  . Not on file  Social Needs  . Financial resource strain: Not on file  . Food insecurity:    Worry: Not on file    Inability: Not on file  . Transportation needs:    Medical: Not on file    Non-medical: Not on file  Tobacco Use  . Smoking status: Former Smoker    Types: Cigarettes  . Smokeless tobacco: Never Used  Substance and Sexual Activity  . Alcohol use: No  . Drug use: No  . Sexual activity: Not on file  Lifestyle  . Physical activity:    Days per week: Not on file    Minutes per session: Not on file  . Stress: Not on file  Relationships  . Social connections:    Talks on phone: Not on file    Gets together: Not on file    Attends religious service: Not on file    Active member of club or organization: Not on file    Attends meetings of clubs or organizations: Not on file    Relationship status: Not on file  . Intimate partner violence:    Fear of current or ex partner: Not on file    Emotionally abused: Not on file    Physically abused: Not on file    Forced sexual activity: Not on file  Other Topics Concern  . Not on file  Social History Narrative  . Not on file    Family History  Problem Relation Age of Onset  . Diabetes Mother      Current Facility-Administered Medications:  .  0.9 %  sodium chloride infusion, , Intravenous, Continuous, Salary, Montell D, MD .  aspirin EC tablet 81 mg, 81 mg, Oral, Daily, Houston Siren, MD, 81 mg at 08/01/17 1408 .  insulin aspart (novoLOG) injection 0-5 Units, 0-5 Units, Subcutaneous, QHS, Houston Siren, MD, 2 Units at 07/30/17 2216 .  insulin aspart (novoLOG) injection 0-9 Units, 0-9 Units, Subcutaneous, TID WC,  Houston Siren, MD, 2 Units at 08/01/17 1204 .  metoprolol succinate (TOPROL-XL) 24 hr tablet 50 mg, 50 mg, Oral, Daily, Sainani, Rolly Pancake, MD, 50 mg at 08/01/17 1405 .  ondansetron (ZOFRAN) tablet 4 mg, 4 mg, Oral, Q6H PRN **OR** ondansetron (ZOFRAN) injection 4 mg, 4 mg, Intravenous, Q6H PRN, Sainani, Rolly Pancake, MD .  patiromer (VELTASSA) packet 8.4 g, 8.4 g, Oral, Daily, Houston Siren, MD  Physical exam:  Vitals:   07/31/17 1404 07/31/17 2002 08/01/17 0334 08/01/17 1320  BP: 136/70 (!) 145/84 (!) 154/90 140/78  Pulse: 87 85 97 89  Resp: 20 18 20 16   Temp: 97.8 F (36.6 C) 97.8 F (36.6 C) 98.3 F (36.8 C) 97.6 F (36.4 C)  TempSrc: Axillary Axillary Axillary Oral  SpO2: 100% 100% 100% 100%  Weight:      Height:       Physical Exam  Constitutional: She appears well-developed and well-nourished.  HENT:  Head: Normocephalic and atraumatic.  Eyes: Pupils are equal, round, and reactive to light. EOM are normal.  Neck: Normal range of motion.  Cardiovascular: Normal rate, regular rhythm and normal heart sounds.  Pulmonary/Chest: Effort normal and breath sounds normal.  Abdominal: Soft. Bowel sounds are normal.  Musculoskeletal: She exhibits edema (RUE swollen).  Neurological: She is alert.  Oriented to self and place  Skin: Skin is warm and dry.     CMP Latest Ref Rng & Units 08/01/2017  Glucose 65 - 99 mg/dL 161(W)  BUN 6 - 20 mg/dL 960(A)  Creatinine 5.40 - 1.00 mg/dL 9.81(X)  Sodium 914 - 782 mmol/L 135  Potassium 3.5 - 5.1 mmol/L 5.3(H)  Chloride 101 - 111 mmol/L 100(L)  CO2 22 - 32 mmol/L 18(L)  Calcium 8.9 - 10.3 mg/dL 9.3  Total Protein 6.5 - 8.1 g/dL 5.9(L)  Total Bilirubin 0.3 - 1.2 mg/dL 4.2(H)  Alkaline Phos 38 - 126 U/L 162(H)  AST 15 - 41 U/L 935(H)  ALT 14 - 54 U/L 1,119(H)   CBC Latest Ref Rng & Units 08/01/2017  WBC 3.6 - 11.0 K/uL 10.3  Hemoglobin 12.0 - 16.0 g/dL 11.0(L)  Hematocrit 35.0 - 47.0 % 34.3(L)  Platelets 150 - 440 K/uL 73(L)     @IMAGES @  Ct Head Wo Contrast  Result Date: 07/14/2017 CLINICAL DATA:  Altered mental status.  Status post CPR. EXAM: CT HEAD WITHOUT CONTRAST TECHNIQUE: Contiguous axial images were obtained from the base of the skull through the vertex without intravenous contrast. COMPARISON:  None. FINDINGS: Brain: Diffusely enlarged ventricles and subarachnoid spaces. Patchy white matter low density in both cerebral hemispheres. Old right occipital and left posterior parietal infarcts. Small, old bilateral cerebellar hemisphere infarcts. No intracranial hemorrhage, mass lesion or CT evidence of acute infarction. Vascular: No hyperdense vessel or unexpected calcification. Skull: Small right frontal bone exostosis/calcified cephalohematoma. Otherwise, unremarkable skull.  Sinuses/Orbits: Unremarkable. Other: None. IMPRESSION: 1. No acute abnormality. 2. Moderate diffuse cerebral and cerebellar atrophy. 3. Moderate chronic small vessel white matter ischemic changes in both cerebral hemispheres. 4. Old infarcts, as described above. Electronically Signed   By: Beckie Salts M.D.   On: 07/14/2017 18:31   Mr Brain Wo Contrast  Result Date: 07/31/2017 CLINICAL DATA:  Altered mental status EXAM: MRI HEAD WITHOUT CONTRAST TECHNIQUE: Multiplanar, multiecho pulse sequences of the brain and surrounding structures were obtained without intravenous contrast. COMPARISON:  Head CT 07/14/2017 FINDINGS: The study is degraded by motion, despite efforts to reduce this artifact, including utilization of motion-resistant MR sequences. The findings of the study are interpreted in the context of reduced sensitivity/specificity. BRAIN: There is no acute infarct, acute hemorrhage or mass effect. The midline structures are normal. 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 Multifocal white matter hyperintensity, most commonly due to chronic ischemic microangiopathy. Generalized atrophy without lobar predilection. Susceptibility-sensitive sequences  show no chronic microhemorrhage or superficial siderosis. VASCULAR: Major intracranial arterial and venous sinus flow voids are preserved. SKULL AND UPPER CERVICAL SPINE: The visualized skull base, calvarium, upper cervical spine and extracranial soft tissues are normal. SINUSES/ORBITS: No fluid levels or advanced mucosal thickening. No mastoid or middle ear effusion. The orbits are normal. IMPRESSION: Severely motion degraded examination without acute intracranial abnormality. 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 Electronically Signed   By: Deatra Robinson M.D.   On: 07/31/2017 03:04   US Renal  Result Date: 07/30/2017 CLINICAL DATA:  Acute renal failure. EXAM: RENAL / URINARY TRACT ULTRASOUND COMPLETE COMPARISON:  None. FINDINGS: Right Kidney: Length: 10.6 cm. Echogenicity within normal limits. No mass or hydronephrosis visualized. Left Kidney: Length: 9.1 cm. Echogenicity within normal limits. A simple appearing cyst is seen in the upper pole measuring 5.0 cm. No mass or hydronephrosis visualized. Bladder: Appears normal for degree of bladder distention. Other: Multiple gallstones incidentally noted. IMPRESSION: No evidence of hydronephrosis or other acute findings. Cholelithiasis incidentally noted. Electronically Signed   By: Myles Rosenthal M.D.   On: 07/30/2017 18:33   US Carotid Bilateral  Result Date: 07/15/2017 CLINICAL DATA:  Syncope. Hypertension, coronary artery disease, hyperlipidemia, diabetes. EXAM: BILATERAL CAROTID DUPLEX ULTRASOUND TECHNIQUE: Wallace Cullens scale imaging, color Doppler and duplex ultrasound was performed of bilateral carotid and vertebral arteries in the neck. COMPARISON:  None. TECHNIQUE: Quantification of carotid stenosis is based on velocity parameters that correlate the residual internal carotid diameter with NASCET-based stenosis levels, using the diameter of the distal internal carotid lumen as the denominator for stenosis  measurement. The following velocity measurements were obtained: PEAK SYSTOLIC/END DIASTOLIC RIGHT ICA:                     64/8cm/sec CCA:                     62/5cm/sec SYSTOLIC ICA/CCA RATIO:  0.9 ECA:                     110cm/sec LEFT ICA:                     81/12cm/sec CCA:                     54/6cm/sec SYSTOLIC ICA/CCA RATIO:  1 ECA:                     160cm/sec FINDINGS: RIGHT CAROTID ARTERY: Mild irregular  plaque in the distal common carotid artery and bulb, proximal internal and external carotid arteries. No high-grade stenosis. Normal waveforms and color Doppler signal. RIGHT VERTEBRAL ARTERY:  Normal flow direction and waveform. LEFT CAROTID ARTERY: Mild irregular plaque through the length of the common carotid artery, in the bulb and proximal ICA. No high-grade stenosis. Normal waveforms and color Doppler signal. LEFT VERTEBRAL ARTERY: Normal flow direction and waveform. IMPRESSION: 1. Extensive nonocclusive bilateral carotid plaque resulting in less than 50% diameter stenosis. 2.  Antegrade bilateral vertebral arterial flow. Electronically Signed   By: Corlis Leak M.D.   On: 07/15/2017 10:51   Dg Chest Port 1 View  Result Date: 07/30/2017 CLINICAL DATA:  84 year old female with altered mental status, lethargy. Acute on chronic renal failure, thrombocytopenia, abnormal LFTs. EXAM: PORTABLE CHEST 1 VIEW COMPARISON:  07/14/2017 portable chest and earlier. FINDINGS: Portable AP upright view at 1546 hours. Continued low lung volumes. Stable cardiomegaly and mediastinal contours. Prior CABG. New veiling opacity at the right lateral lung base, although most resembles a small pleural effusion. No pneumothorax or pulmonary edema. The left lung base appears clear. Negative visible bowel gas pattern. Stable visualized osseous structures. IMPRESSION: 1. New veiling opacity at the right lung base, but more resembles a small pleural effusion than pneumonia. 2. Otherwise stable chest: Chronic low lung volumes  and cardiomegaly. Electronically Signed   By: Odessa Fleming M.D.   On: 07/30/2017 16:07   Dg Chest Port 1 View  Result Date: 07/14/2017 CLINICAL DATA:  Status post CPR. EXAM: PORTABLE CHEST 1 VIEW COMPARISON:  10/10/2009. FINDINGS: Poor inspiration. Minimal patchy opacity at both medial lung bases. Otherwise, clear lungs. Stable post CABG changes. Thoracic spine degenerative changes. Stable mildly enlarged cardiac silhouette. IMPRESSION: Poor inspiration with minimal patchy bibasilar atelectasis or aspiration pneumonitis. Electronically Signed   By: Beckie Salts M.D.   On: 07/14/2017 18:26   Dg Ugi  W/kub  Result Date: 07/31/2017 CLINICAL DATA:  Dysphagia. EXAM: UPPER GI SERIES WITH KUB TECHNIQUE: After obtaining a scout radiograph a routine upper GI series was performed using thin barium. FLUOROSCOPY TIME:  Fluoroscopy Time:  1 minutes 42 seconds Radiation Exposure Index (if provided by the fluoroscopic device): 27.6 mGy Number of Acquired Spot Images: 3 COMPARISON:  Ultrasound 07/30/2017. FINDINGS: Calcific densities noted right upper quadrant consistent with known gallstones. Pelvic calcifications consistent fibroids. Aortoiliac atherosclerotic vascular calcifications. Degenerative changes thoracolumbar spine. Total right hip replacement. This was a limited study as patient had difficulty swallowing. No evidence of esophageal obstruction. The stomach could not be evaluated due to the small amount of barium swallowed. IMPRESSION: Limited exam as the patient had difficulty swallowing. No evidence of esophageal obstruction. The stomach could not be evaluated due to the small amount of barium swallowed. Electronically Signed   By: Maisie Fus  Register   On: 07/31/2017 09:27   US Abdomen Limited Ruq  Result Date: 07/31/2017 CLINICAL DATA:  Abnormal LFTs EXAM: ULTRASOUND ABDOMEN LIMITED RIGHT UPPER QUADRANT COMPARISON:  None. FINDINGS: Gallbladder: Multiple calculi are identified within the gallbladder. Gallbladder  wall is not thickened. No pericholecystic fluid is seen. Common bile duct: Diameter: 3.6 mm. Liver: Diffuse increased echogenicity is noted without focal mass. Portal vein is patent on color Doppler imaging with normal direction of blood flow towards the liver. Incidental note is made of a small right pleural effusion. Minimal ascites is also noted. IMPRESSION: Cholelithiasis without complicating factors. Changes most consistent with fatty infiltration of the liver. Small right pleural effusion and minimal ascites. Electronically  Signed   By: Alcide Clever M.D.   On: 07/31/2017 09:39     Assessment and plan- Patient is a 84 y.o. female admitted for acute encephalopathy and acute liver failure. We have been consulted for thrombocytopenia  1. Elevated liver enzymes, worsening bilirubin, elevated PT/INR and decreased fibrinogen consisted with acute liver failure. I suspect she has a component of acute DIC that is the cause of her thrombocytopenia. Moderate thrombocytopenia, elevated PT/INR and low fibrinogen support the diagnosis. Recommend treatment of underlying cause of DIC which may be underlying acute liver failure and acute kidney injury. No schistocytes in smear today as well.  Currently patient is not bleeding and there is no need to replace fibrinogen or no need for prophylactic transfusion. If she were to have clinical bleeding:  Recommend giving cryoppt if fibrinogen <100.  If there is bleeding and fibrinogen >100 give FFP.  No indication for platelet transfusion at this time. Please check daily coags and fibrinogen.   LDH elevated likely due to underlying acute liver failure  Consider RUE to r/o DVT given RUE swelling if it does not improve   AKI on CKD- being followed by nephrology  Please get in touch with Dr. Orlie Dakin for any acute issues over the weekend.    Visit Diagnosis 1. Abnormal LFTs   2. Dysphagia   3. Shortness of breath   4. ARF (acute renal failure) (HCC)       Dr. Owens Shark, MD, MPH [facility] [phone] 08/01/2017 4:45 PM

## 2017-08-01 NOTE — H&P (Signed)
Elizabeth Darby, MD [address]  [place]  [place], [place] [address]  Main: [phone]  Fax: [phone] Pager: [phone]   Consultation  Referring Provider:     No ref. provider found Primary Care Physician:  Baxter Hire, MD Primary Gastroenterologist:  Dr. Sherri Sear         Reason for Consultation:     Elevated LFTs  Date of Admission:  07/30/2017 Date of Consultation:  08/01/2017         HPI:   Elizabeth [name] is a 84 y.o. female with history of diabetes, hypertension, hyperlipidemia, stage III chronic kidney disease, history of coronary artery disease, status post bypass, ischemic cardiomyopathy, EF of 25% who presents with altered mental status, lethargy from home. Patient is a poor historian and history provided by her husband. She had a poor by mouth intake for the last 2 days. She was recently discharged from [facility] after admission for hyponatremia and worsening kidney function. On further investigation, patient is found to have worsening kidney function, abnormal LFTs and predominantly elevated transaminases and worsening thrombocytopenia. GI is consulted for elevated LFTs. She is evaluated by oncology for thrombocytopenia felt it is secondary to acute illness. Patient does not have cirrhosis or chronic liver disease other than hepatic steatosis based on the recent ultrasound. According to her husband, patient received transfusions in her life in the past. Unknown hepatitis B or C status. He denies any recent fever, chills recent antibiotic use.  Her LFTs during this admission revealed alkaline phosphatase 162, AST 878, ALT 1004, total bilirubin 3.9. Her LFTs were last normal during previous admission in 06/2017.  NSAIDs: none  Antiplts/Anticoagulants/Anti thrombotics: none  GI Procedures: none  Past Medical History:  Diagnosis Date  . Diabetes mellitus without complication ([place])   . Hyperlipidemia   . Hypertension   .  Peripheral arterial disease [facility])     Past Surgical History:  Procedure Laterality Date  . CARDIAC SURGERY     triple bypass  . FEMORAL-FEMORAL BYPASS GRAFT    . JOINT REPLACEMENT     right hip  . LEG SURGERY Left    leg bypass    Prior to Admission medications   Medication Sig Start Date End Date Taking? Authorizing Provider  aspirin EC 81 MG tablet Take 81 mg by mouth daily.    Yes [provider]  colchicine 0.6 MG tablet Take 1 tablet (0.6 mg total) by mouth daily as needed (PRN gout). 07/16/17  Yes Salary, Avel Peace, MD  ezetimibe (ZETIA) 10 MG tablet Take 10 mg by mouth daily. 07/14/17  Yes [provider]  metoprolol succinate (TOPROL-XL) 50 MG 24 hr tablet TAKE 1 TABLET BY MOUTH ONCE DAILY. 07/28/15  Yes [provider]  niacin (NIASPAN) 500 MG CR tablet Take 500 mg by mouth at bedtime.  05/04/15 07/15/18 Yes [provider]  sitaGLIPtin (JANUVIA) 25 MG tablet Take 1 tablet (25 mg total) by mouth daily. 07/16/17  Yes Salary, Montell D, MD  sodium chloride 1 g tablet Take 1 tablet (1 g total) by mouth 3 (three) times daily with meals. 07/16/17  Yes Salary, Avel Peace, MD  Calcium Carb-Cholecalciferol (CALCIUM-VITAMIN D) 500-200 MG-UNIT tablet Take 1 tablet by mouth 2 (two) times daily.     [provider]    Family History  Problem Relation Age of Onset  . Diabetes Mother      Social History   Tobacco Use  .  Smoking status: Former Smoker    Types: Cigarettes  . Smokeless tobacco: Never Used  Substance Use Topics  . Alcohol use: No  . Drug use: No    Allergies as of 07/30/2017 - Review Complete 07/30/2017  Allergen Reaction Noted  . Meperidine Nausea Only 05/07/2013    Review of Systems:    All systems reviewed and negative except where noted in HPI.   Physical Exam:  Vital signs in last 24 hours: Temp:  [97.6 F (36.4 C)-98.3 F (36.8 C)] 97.6 F (36.4 C) (06/14 1320) Pulse Rate:  [85-97] 89 (06/14 1320) Resp:   [16-20] 16 (06/14 1320) BP: (140-154)/(78-90) 140/78 (06/14 1320) SpO2:  [100 %] 100 % (06/14 1320)   General:   Pleasant, cooperative in NAD Head:  Normocephalic and atraumatic. Eyes:   No icterus.   Conjunctiva pink. PERRLA. Ears:  Normal auditory acuity. Neck:  Supple; no masses or thyroidomegaly Lungs: Respirations even and unlabored. Lungs clear to auscultation bilaterally.   No wheezes, crackles, or rhonchi.  Heart:  Regular rate and rhythm;  Without murmur, clicks, rubs or gallops Abdomen:  Soft, nondistended, nontender. Normal bowel sounds. No appreciable masses or hepatomegaly.  No rebound or guarding.  Rectal:  Not performed. Msk:  Symmetrical without gross deformities.  Extremities:  Without edema, cyanosis or clubbing. Neurologic:  Alert and oriented x2;  grossly normal neurologically. Skin:  Intact without significant lesions or rashes. Psych:  Alert and cooperative. Normal affect.  LAB RESULTS: CBC Latest Ref Rng & Units 08/01/2017 07/31/2017 07/30/2017  WBC 3.6 - 11.0 K/uL 10.3 10.0 9.8  Hemoglobin 12.0 - 16.0 g/dL 11.0(L) 10.9(L) 12.5  Hematocrit 35.0 - 47.0 % 34.3(L) 33.4(L) 37.9  Platelets 150 - 440 K/uL 73(L) 72(L) 85(L)    BMET BMP Latest Ref Rng & Units 08/01/2017 07/31/2017 07/30/2017  Glucose 65 - 99 mg/dL 162(H) 274(H) -  BUN 6 - 20 mg/dL 106(H) 99(H) -  Creatinine 0.44 - 1.00 mg/dL 3.40(H) 3.58(H) 3.95(H)  Sodium 135 - 145 mmol/L 135 130(L) -  Potassium 3.5 - 5.1 mmol/L 5.3(H) 5.5(H) -  Chloride 101 - 111 mmol/L 100(L) 93(L) -  CO2 22 - 32 mmol/L 18(L) 22 -  Calcium 8.9 - 10.3 mg/dL 9.3 9.3 -    LFT Hepatic Function Latest Ref Rng & Units 08/01/2017 07/31/2017 07/14/2017  Total Protein 6.5 - 8.1 g/dL 5.9(L) 6.0(L) 6.8  Albumin 3.5 - 5.0 g/dL 3.7 3.8 4.1  AST 15 - 41 U/L 935(H) 878(H) 23  ALT 14 - 54 U/L 1,119(H) 1,004(H) 21  Alk Phosphatase 38 - 126 U/L 162(H) 162(H) 55  Total Bilirubin 0.3 - 1.2 mg/dL 4.2(H) 3.9(H) 0.9     STUDIES: Mr Brain Wo  Contrast  Result Date: 07/31/2017 CLINICAL DATA:  Altered mental status EXAM: MRI HEAD WITHOUT CONTRAST TECHNIQUE: Multiplanar, multiecho pulse sequences of the brain and surrounding structures were obtained without intravenous contrast. COMPARISON:  Head CT 07/14/2017 FINDINGS: The study is degraded by motion, despite efforts to reduce this artifact, including utilization of motion-resistant MR sequences. The findings of the study are interpreted in the context of reduced sensitivity/specificity. BRAIN: There is no acute infarct, acute hemorrhage or mass effect. The midline structures are normal. 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 Multifocal white matter hyperintensity, most commonly due to chronic ischemic microangiopathy. Generalized atrophy without lobar predilection. Susceptibility-sensitive sequences show no chronic microhemorrhage or superficial siderosis. VASCULAR: Major intracranial arterial and venous sinus flow voids are preserved. SKULL AND UPPER CERVICAL SPINE: The visualized skull base, calvarium,  upper cervical spine and extracranial soft tissues are normal. SINUSES/ORBITS: No fluid levels or advanced mucosal thickening. No mastoid or middle ear effusion. The orbits are normal. IMPRESSION: Severely motion degraded examination without acute intracranial abnormality. 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 Electronically Signed   By: Ulyses Jarred M.D.   On: 07/31/2017 03:04   Dg Ugi  W/kub  Result Date: 07/31/2017 CLINICAL DATA:  Dysphagia. EXAM: UPPER GI SERIES WITH KUB TECHNIQUE: After obtaining a scout radiograph a routine upper GI series was performed using thin barium. FLUOROSCOPY TIME:  Fluoroscopy Time:  1 minutes 42 seconds Radiation Exposure Index (if provided by the fluoroscopic device): 27.6 mGy Number of Acquired Spot Images: 3 COMPARISON:  Ultrasound 07/30/2017. FINDINGS: Calcific densities noted right upper quadrant consistent with  known gallstones. Pelvic calcifications consistent fibroids. Aortoiliac atherosclerotic vascular calcifications. Degenerative changes thoracolumbar spine. Total right hip replacement. This was a limited study as patient had difficulty swallowing. No evidence of esophageal obstruction. The stomach could not be evaluated due to the small amount of barium swallowed. IMPRESSION: Limited exam as the patient had difficulty swallowing. No evidence of esophageal obstruction. The stomach could not be evaluated due to the small amount of barium swallowed. Electronically Signed   By: Marcello Moores  Register   On: 07/31/2017 09:27   US Abdomen Limited Ruq  Result Date: 07/31/2017 CLINICAL DATA:  Abnormal LFTs EXAM: ULTRASOUND ABDOMEN LIMITED RIGHT UPPER QUADRANT COMPARISON:  None. FINDINGS: Gallbladder: Multiple calculi are identified within the gallbladder. Gallbladder wall is not thickened. No pericholecystic fluid is seen. Common bile duct: Diameter: 3.6 mm. Liver: Diffuse increased echogenicity is noted without focal mass. Portal vein is patent on color Doppler imaging with normal direction of blood flow towards the liver. Incidental note is made of a small right pleural effusion. Minimal ascites is also noted. IMPRESSION: Cholelithiasis without complicating factors. Changes most consistent with fatty infiltration of the liver. Small right pleural effusion and minimal ascites. Electronically Signed   By: Inez Catalina M.D.   On: 07/31/2017 09:39      Impression / Plan:   Elizabeth [name] is a 84 y.o. African-American female with coronary Artery disease, ischemic cardiomyopathy EF of 25%, peripheral artery disease, dementia admitted with altered mental status and lethargy found to have acute hepatitis as well as worsening kidney function  Elevated LFTs:no evidence of chronic liver disease or cirrhosis Predominantly hepatocellular pattern: probably secondary to ischemic hepatitis in the setting of dehydration and severe  ischemic cardiomyopathy Ultrasound abdomen did not reveal evidence of biliary obstruction or acute cholecystitis She does have cholelithiasis Check viral hepatitis panel Monitor daily LFTs Avoid hepatotoxic agents Would not recommend to continue niacin upon discharge Recommend adequate hydration Rule out infectious source  Thank you for involving me in the care of this patient.  Dr. Vicente Males to cover from tomorrow    LOS: 2 days   Sherri Sear, MD  08/01/2017, 7:32 PM   Note: This dictation was prepared with Dragon dictation along with smaller phrase technology. Any transcriptional errors that result from this process are unintentional.

## 2017-08-01 NOTE — Progress Notes (Signed)
Initial Nutrition Assessment  DOCUMENTATION CODES:   Not applicable  INTERVENTION:   Ensure Enlive po BID, each supplement provides 350 kcal and 20 grams of protein  Magic cup TID with meals, each supplement provides 290 kcal and 9 grams of protein  MVI daily  NUTRITION DIAGNOSIS:   Inadequate oral intake related to acute illness as evidenced by per patient/family report.  GOAL:   Patient will meet greater than or equal to 90% of their needs  MONITOR:   PO intake, Supplement acceptance, Labs, Weight trends, I & O's, Skin  REASON FOR ASSESSMENT:   Consult Assessment of nutrition requirement/status  ASSESSMENT:   84 y.o. female with dementia, diabetes, hypertension, hyperlipidemia, chronic kidney disease stage IV, peripheral vascular disease, triple-vessel CABG, chronic systolic congestive heart failure with LVEF 20 to 25% from echo in May 2019, history of femoral bypass, who was admitted to [facility] directly from PMD office on 07/30/2017 for evaluation of acute renal failure and elevated liver enzymes.   Visited pt's in room today. Pt with dementia and AMS so history obtained from pt's husband at bedside. Per pt's husband, pt with good appetite and oral intake after discharge on 5/29; pt documented to be eating 75%-100% of meals at that time. Husband reports that once pt got home, she had a slow decline in her mental status and appetite and that she began choking and coughing food back up. Pt had been drinking some chocolate Boost at home. Husband reports a 17lbs(9%) wt loss over the past 6 months but per chart, pt appears to have lost around 10lbs(6%). This is not significant weight loss for the time frame. Pt evaluated by SLP today; recommended for full liquid diet. RD will order supplements and MVI to help pt meet her estimated needs. Suspect pt is a moderate refeed risk; recommend monitor K, Mg, and P labs when oral intake improves.    Medications reviewed and include: aspirin,  insulin, veltassa, NaCl @30ml /hr  Labs reviewed: K 5.3(H), Cl 100(L), BUN 106(H), creat 3.40(H), alkphos 162(H), AST 935(H), ALT 1119(H), tbili 4.2(H) Hgb 11.0(L), Hct 34.3(L), MCV 76.8(L), MCH 24.8(L) Folate 13.9- 6/13 B12- 5732(H) cbgs- 217, 194, 158, 164, 171 x 24 hrs AIC 6.4(H)- 5/27  NUTRITION - FOCUSED PHYSICAL EXAM:    Most Recent Value  Orbital Region  No depletion  Upper Arm Region  No depletion  Thoracic and Lumbar Region  No depletion  Buccal Region  No depletion  Temple Region  No depletion  Clavicle Bone Region  No depletion  Clavicle and Acromion Bone Region  No depletion  Scapular Bone Region  No depletion  Dorsal Hand  No depletion  Patellar Region  Moderate depletion  Anterior Thigh Region  Moderate depletion  Posterior Calf Region  Moderate depletion  Edema (RD Assessment)  Mild  Hair  Reviewed  Eyes  Reviewed  Mouth  Reviewed  Skin  Reviewed  Nails  Reviewed     Diet Order:   Diet Order           Diet full liquid Room service appropriate? Yes with Assist; Fluid consistency: Thin  Diet effective now         EDUCATION NEEDS:   No education needs have been identified at this time  Skin:  Skin Assessment: Reviewed RN Assessment(skin tears and abrasions )  Last BM:  pta  Height:   Ht Readings from Last 1 Encounters:  07/30/17 5\' 4"  (1.626 m)    Weight:   Wt Readings from Last  1 Encounters:  07/30/17 156 lb 8.4 oz (71 kg)    Ideal Body Weight:  54.5 kg  BMI:  Body mass index is 26.87 kg/m.  Estimated Nutritional Needs:   Kcal:  1400-1600kcal/day   Protein:  71-85g/day   Fluid:  >1.4L/day   Betsey Holiday MS, RD, LDN Pager #- [phone] Office#- [phone] After Hours Pager: [phone]

## 2017-08-01 NOTE — Progress Notes (Signed)
[facility] [place], [place] 08/01/17  Subjective:   Patient remains critically ill.   Lab results from this morning show slight improvement in creatinine to 3.40, potassium of 5.3.   Bicarbonate slightly lower.  LFTs are worse.  Objective:  Vital signs in last 24 hours:  Temp:  [97.6 F (36.4 C)-98.3 F (36.8 C)] 97.6 F (36.4 C) (06/14 1320) Pulse Rate:  [85-97] 89 (06/14 1320) Resp:  [16-20] 16 (06/14 1320) BP: (140-154)/(78-90) 140/78 (06/14 1320) SpO2:  [100 %] 100 % (06/14 1320)  Weight change:  Filed Weights   07/30/17 1530  Weight: 71 kg (156 lb 8.4 oz)    Intake/Output:    Intake/Output Summary (Last 24 hours) at 08/01/2017 1639 Last data filed at 08/01/2017 0322 Gross per 24 hour  Intake 970 ml  Output 400 ml  Net 570 ml     Physical Exam: General:  Ill-appearing, laying in the bed  HEENT  moist oral mucous membranes  Neck  supple  Pulm/lungs  normal breathing effort, mild basilar crackles  CVS/Heart  irregular, no rub  Abdomen:   Soft, nontender  Extremities:  No significant edema  Neurologic:  Alert, able to follow commands, oriented to self and family  Skin:  No acute rashes          Basic Metabolic Panel:  Recent Labs  Lab 07/30/17 1613 07/31/17 0645 08/01/17 0544  NA  --  130* 135  K  --  5.5* 5.3*  CL  --  93* 100*  CO2  --  22 18*  GLUCOSE  --  274* 162*  BUN  --  99* 106*  CREATININE 3.95* 3.58* 3.40*  CALCIUM  --  9.3 9.3     CBC: Recent Labs  Lab 07/30/17 1613 07/31/17 0645 08/01/17 0544  WBC 9.8 10.0 10.3  NEUTROABS  --   --  9.6*  HGB 12.5 10.9* 11.0*  HCT 37.9 33.4* 34.3*  MCV 77.8* 76.6* 76.8*  PLT 85* 72* 73*     No results found for: HEPBSAG, HEPBSAB, HEPBIGM    Microbiology:  No results found for this or any previous visit (from the past 240 hour(s)).  Coagulation Studies: Recent Labs    07/31/17 1620  LABPROT 28.3*  INR 2.68    Urinalysis: No results for input(s):  COLORURINE, LABSPEC, PHURINE, GLUCOSEU, HGBUR, BILIRUBINUR, KETONESUR, PROTEINUR, UROBILINOGEN, NITRITE, LEUKOCYTESUR in the last 72 hours.  Invalid input(s): APPERANCEUR    Imaging: Mr Brain Wo Contrast  Result Date: 07/31/2017 CLINICAL DATA:  Altered mental status EXAM: MRI HEAD WITHOUT CONTRAST TECHNIQUE: Multiplanar, multiecho pulse sequences of the brain and surrounding structures were obtained without intravenous contrast. COMPARISON:  Head CT 07/14/2017 FINDINGS: The study is degraded by motion, despite efforts to reduce this artifact, including utilization of motion-resistant MR sequences. The findings of the study are interpreted in the context of reduced sensitivity/specificity. BRAIN: There is no acute infarct, acute hemorrhage or mass effect. The midline structures are normal. 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 Multifocal white matter hyperintensity, most commonly due to chronic ischemic microangiopathy. Generalized atrophy without lobar predilection. Susceptibility-sensitive sequences show no chronic microhemorrhage or superficial siderosis. VASCULAR: Major intracranial arterial and venous sinus flow voids are preserved. SKULL AND UPPER CERVICAL SPINE: The visualized skull base, calvarium, upper cervical spine and extracranial soft tissues are normal. SINUSES/ORBITS: No fluid levels or advanced mucosal thickening. No mastoid or middle ear effusion. The orbits are normal. IMPRESSION: Severely motion degraded examination without acute intracranial abnormality. Old bilateral PCA  territory infarcts and findings of chronic ischemic microangiopathy. Electronically Signed   By: Deatra Robinson M.D.   On: 07/31/2017 03:04   US Renal  Result Date: 07/30/2017 CLINICAL DATA:  Acute renal failure. EXAM: RENAL / URINARY TRACT ULTRASOUND COMPLETE COMPARISON:  None. FINDINGS: Right Kidney: Length: 10.6 cm. Echogenicity within normal limits. No mass or hydronephrosis visualized. Left Kidney: Length: 9.1  cm. Echogenicity within normal limits. A simple appearing cyst is seen in the upper pole measuring 5.0 cm. No mass or hydronephrosis visualized. Bladder: Appears normal for degree of bladder distention. Other: Multiple gallstones incidentally noted. IMPRESSION: No evidence of hydronephrosis or other acute findings. Cholelithiasis incidentally noted. Electronically Signed   By: Myles Rosenthal M.D.   On: 07/30/2017 18:33   Dg Kayleen Memos  W/kub  Result Date: 07/31/2017 CLINICAL DATA:  Dysphagia. EXAM: UPPER GI SERIES WITH KUB TECHNIQUE: After obtaining a scout radiograph a routine upper GI series was performed using thin barium. FLUOROSCOPY TIME:  Fluoroscopy Time:  1 minutes 42 seconds Radiation Exposure Index (if provided by the fluoroscopic device): 27.6 mGy Number of Acquired Spot Images: 3 COMPARISON:  Ultrasound 07/30/2017. FINDINGS: Calcific densities noted right upper quadrant consistent with known gallstones. Pelvic calcifications consistent fibroids. Aortoiliac atherosclerotic vascular calcifications. Degenerative changes thoracolumbar spine. Total right hip replacement. This was a limited study as patient had difficulty swallowing. No evidence of esophageal obstruction. The stomach could not be evaluated due to the small amount of barium swallowed. IMPRESSION: Limited exam as the patient had difficulty swallowing. No evidence of esophageal obstruction. The stomach could not be evaluated due to the small amount of barium swallowed. Electronically Signed   By: Maisie Fus  Register   On: 07/31/2017 09:27   US Abdomen Limited Ruq  Result Date: 07/31/2017 CLINICAL DATA:  Abnormal LFTs EXAM: ULTRASOUND ABDOMEN LIMITED RIGHT UPPER QUADRANT COMPARISON:  None. FINDINGS: Gallbladder: Multiple calculi are identified within the gallbladder. Gallbladder wall is not thickened. No pericholecystic fluid is seen. Common bile duct: Diameter: 3.6 mm. Liver: Diffuse increased echogenicity is noted without focal mass. Portal vein is  patent on color Doppler imaging with normal direction of blood flow towards the liver. Incidental note is made of a small right pleural effusion. Minimal ascites is also noted. IMPRESSION: Cholelithiasis without complicating factors. Changes most consistent with fatty infiltration of the liver. Small right pleural effusion and minimal ascites. Electronically Signed   By: Alcide Clever M.D.   On: 07/31/2017 09:39     Medications:   . sodium chloride     . aspirin EC  81 mg Oral Daily  . feeding supplement (ENSURE ENLIVE)  237 mL Oral BID BM  . insulin aspart  0-5 Units Subcutaneous QHS  . insulin aspart  0-9 Units Subcutaneous TID WC  . metoprolol succinate  50 mg Oral Daily  . [START ON 08/02/2017] multivitamin with minerals  1 tablet Oral Daily  . patiromer  8.4 g Oral Daily   ondansetron **OR** ondansetron (ZOFRAN) IV  Assessment/ Plan:  84 y.o. African-American female with  diabetes, hypertension, hyperlipidemia, chronic kidney disease stage IV, peripheral vascular disease, triple-vessel CABG, chronic systolic congestive heart failure with LVEF 20 to 25% from echo in May 2019, history of femoral bypass, who was admitted to [facility] directly from PMD office on 07/30/2017 for evaluation of acute renal failure and elevated liver enzymes.   1.  Acute kidney injury 2.  Chronic kidney disease stage IV, baseline creatinine 2.17/GFR 23 from 07/16/2017 3.  Severe hyperkalemia 4.  Metabolic acidosis/  Lactic acidosis 5.  Acute hepatitis/transaminitis 6.  Altered mental status 7.  Thrombocytopenia 8.  Chronic systolic congestive heart failure with LVEF 20 to 25% from May 2019  Case discussed with hematologist.  No schistocytes noted on peripheral smear.  Right upper quadrant ultrasound shows cholelithiasis, fatty infiltration of liver. LFTs are worse. Coagulopathic with INR 2.7 Patient's mental status appears to have improved. Renal ultrasound without any evidence of hydronephrosis.  Serum creatinine  and potassium level have improved.  Continue conservative management.  No acute indication for hemodialysis at present.     LOS: 2 Deontrae Drinkard Thedore Mins 6/14/20194:39 PM  [facility] [address] [place], [place] [phone]  Note: This note was prepared with Dragon dictation. Any transcription errors are unintentional

## 2017-08-01 NOTE — Evaluation (Signed)
Clinical/Bedside Swallow Evaluation Patient Details  Name: Elizabeth [name] MRN: [id number] Date of Birth: 1929-08-18  Today's Date: 08/01/2017 Time: SLP Start Time (ACUTE ONLY): 1300 SLP Stop Time (ACUTE ONLY): 1400 SLP Time Calculation (min) (ACUTE ONLY): 60 min  Past Medical History:  Past Medical History:  Diagnosis Date  . Diabetes mellitus without complication (HCC)   . Hyperlipidemia   . Hypertension   . Peripheral arterial disease (HCC)    Past Surgical History:  Past Surgical History:  Procedure Laterality Date  . CARDIAC SURGERY     triple bypass  . FEMORAL-FEMORAL BYPASS GRAFT    . JOINT REPLACEMENT     right hip  . LEG SURGERY Left    leg bypass   HPI:  Pt is a 84 y.o. female with a known history of Dementia, DM, hypertension, hyperlipidemia, chronic kidney disease stage III, recent admission for hyponatremia who presents to the hospital directly admitted from her primary care physician's office due to altered mental status, lethargy and weakness.  Patient herself is a poor historian therefore most of the history obtained from the niece at bedside.  As per the niece patient was just discharged from the hospital 2 weeks ago and since then has been not doing well.  Patient has been more confused, lethargic her appetite has been poor.  Pt also has a baseline of Dementia and poor swallowing just prior to admission; some N/V per report. Pt requires assistance w/ ADLs.    Assessment / Plan / Recommendation Clinical Impression  Pt appears to present w/ fairly adequate oropharyngeal phase swallow function, however, w/ current illness and baseline dx of Dementia, pt's awareness during tasks of oral intake is inconsistent and requires Cues for follow through. Family present agreed she needed encouragement and cues. Pt was positioned upright and presented w/ trials of thin liquids via Cup( she helped to hold cup) and purees. No overt s/s of aspiration noted w/ thin liquid and puree  trials following general aspiration precautions; no decline in respiratrory status or vocal quality post trials; vocal quality b/t trials remained clear. Oral phase was grossly [facility] w/ trials of liquids, however, w/ trials of purees, pt exhibited lengthy oral phase time, oral holding, and prolonged oral clearing; cues needed. Pt stated "just a minute" when encouraged to clear orally - suspect this is related to her baseline Dementia. Pt's OM exam appeared grossly WFL. Pt required full feeding support though she helped to hold cup to drink - this was recommended to family as well to aid engagement/Cognition. Recommend a Full Liquid diet (more purees and liquids) w/ thin liquids w/ aspiration precautions. Full feeding assistance at meals. Recommend Pills in Puree(crushed, liquid forms as needed) for safer swallowing. Recommend Dietician f/u for nutritional support. NSG and MD updated. ST services will f/u for toleration of diet, trials to upgrade, and education w/ pt/family. Pt was encouraged to practice wearing her upper denture plate over the weekend for readiness for increased textured foods soon. As pt only accepted a few trials(small amounts each, nutritional needs may be challenged - family may need further education from MD re: this.  SLP Visit Diagnosis: Dysphagia, oral phase (R13.11)(Cognitive decline)    Aspiration Risk  Risk for inadequate nutrition/hydration(reduced risk for aspiration following precautions)    Diet Recommendation  Full Liquid diet (more purees, liquids); Thin liquids. General aspiration precautions and feeding support at meals  Medication Administration: Crushed with puree(or in liquid forms for safer swallowing)    Other  Recommendations Recommended  Consults: (Dietician f/u; Palliative Care consult) Oral Care Recommendations: Oral care BID;Staff/trained caregiver to provide oral care Other Recommendations: (n/a)   Follow up Recommendations (TBD)      Frequency and  Duration min 2x/week  2 weeks       Prognosis Prognosis for Safe Diet Advancement: Fair Barriers to Reach Goals: Cognitive deficits;Time post onset;Severity of deficits;Behavior Barriers/Prognosis Comment: Dementia baseline      Swallow Study   General Date of Onset: 07/30/17 HPI: Pt is a 84 y.o. female with a known history of Dementia, DM, hypertension, hyperlipidemia, chronic kidney disease stage III, recent admission for hyponatremia who presents to the hospital directly admitted from her primary care physician's office due to altered mental status, lethargy and weakness.  Patient herself is a poor historian therefore most of the history obtained from the niece at bedside.  As per the niece patient was just discharged from the hospital 2 weeks ago and since then has been not doing well.  Patient has been more confused, lethargic her appetite has been poor.  Pt also has a baseline of Dementia and poor swallowing just prior to admission; some N/V per report. Pt requires assistance w/ ADLs.  Type of Study: Bedside Swallow Evaluation Previous Swallow Assessment: none reported Diet Prior to this Study: NPO(regular diet at home) Temperature Spikes Noted: No(wbc 10.3) Respiratory Status: Nasal cannula(2 liters) History of Recent Intubation: No Behavior/Cognition: Alert;Cooperative;Pleasant mood;Confused;Distractible;Requires cueing Oral Cavity Assessment: Within Functional Limits Oral Care Completed by SLP: Recent completion by staff Oral Cavity - Dentition: Adequate natural dentition Vision: Functional for self-feeding Self-Feeding Abilities: Able to feed self;Needs set up;Needs assist;Total assist Patient Positioning: Upright in bed Baseline Vocal Quality: Normal(mumbled speech at times) Volitional Cough: Strong(w/ cues) Volitional Swallow: Able to elicit(w/ cues)    Oral/Motor/Sensory Function Overall Oral Motor/Sensory Function: Within functional limits(w/ bolus management and  clearing)   Ice Chips Ice chips: Within functional limits Presentation: Spoon(fed; 3 trials)   Thin Liquid Thin Liquid: Within functional limits Presentation: Cup;Self Fed(supported fully; 6 trials accepted) Other Comments: pt required verbal/visual/tactile cues to direct attention to tasks of po's    Nectar Thick Nectar Thick Liquid: Not tested   Honey Thick Honey Thick Liquid: Not tested   Puree Puree: Impaired Presentation: Spoon(fed; 3 trials) Oral Phase Impairments: Poor awareness of bolus Oral Phase Functional Implications: Oral holding;Prolonged oral transit(lengty time - "just a minute") Pharyngeal Phase Impairments: (none) Other Comments: lengthy oral phase time; cues needed   Solid   GO   Solid: Not tested Other Comments: d/t Cognitive attention to task         Jerilynn Som, MS, CCC-SLP Watson,Katherine 08/01/2017,5:34 PM

## 2017-08-01 NOTE — Progress Notes (Signed)
[facility] [place] at [facility]   PATIENT NAME: Elizabeth [name]    MR#:  [id number]  DATE OF BIRTH:  [birth year]  SUBJECTIVE:  CHIEF COMPLAINT:  No chief complaint on file. Husband is at the bedside, patient is doing much better her husband, patient is much more awake/alert, back to her baseline, case discussed with speech therapy-dietary to see, start on full liquid diet, patient is DNR per husband, oncology input appreciated  REVIEW OF SYSTEMS:  CONSTITUTIONAL: No fever, fatigue or weakness.  EYES: No blurred or double vision.  EARS, NOSE, AND THROAT: No tinnitus or ear pain.  RESPIRATORY: No cough, shortness of breath, wheezing or hemoptysis.  CARDIOVASCULAR: No chest pain, orthopnea, edema.  GASTROINTESTINAL: No nausea, vomiting, diarrhea or abdominal pain.  GENITOURINARY: No dysuria, hematuria.  ENDOCRINE: No polyuria, nocturia,  HEMATOLOGY: No anemia, easy bruising or bleeding SKIN: No rash or lesion. MUSCULOSKELETAL: No joint pain or arthritis.   NEUROLOGIC: No tingling, numbness, weakness.  PSYCHIATRY: No anxiety or depression.   ROS  DRUG ALLERGIES:   Allergies  Allergen Reactions  . Meperidine Nausea Only    VITALS:  Blood pressure 140/78, pulse 89, temperature 97.6 F (36.4 C), temperature source Oral, resp. rate 16, height 5\' 4"  (1.626 m), weight 71 kg (156 lb 8.4 oz), SpO2 100 %.  PHYSICAL EXAMINATION:  GENERAL:  84 y.o.-year-old patient lying in the bed with no acute distress.  EYES: Pupils equal, round, reactive to light and accommodation. No scleral icterus. Extraocular muscles intact.  HEENT: Head atraumatic, normocephalic. Oropharynx and nasopharynx clear.  NECK:  Supple, no jugular venous distention. No thyroid enlargement, no tenderness.  LUNGS: Normal breath sounds bilaterally, no wheezing, rales,rhonchi or crepitation. No use of accessory muscles of respiration.  CARDIOVASCULAR: S1, S2 normal. No murmurs, rubs, or gallops.  ABDOMEN:  Soft, nontender, nondistended. Bowel sounds present. No organomegaly or mass.  EXTREMITIES: No pedal edema, cyanosis, or clubbing.  NEUROLOGIC: Cranial nerves II through XII are intact. Muscle strength 5/5 in all extremities. Sensation intact. Gait not checked.  PSYCHIATRIC: The patient is alert and oriented x 3.  SKIN: No obvious rash, lesion, or ulcer.   Physical Exam LABORATORY PANEL:   CBC Recent Labs  Lab 08/01/17 0544  WBC 10.3  HGB 11.0*  HCT 34.3*  PLT 73*   ------------------------------------------------------------------------------------------------------------------  Chemistries  Recent Labs  Lab 08/01/17 0544  NA 135  K 5.3*  CL 100*  CO2 18*  GLUCOSE 162*  BUN 106*  CREATININE 3.40*  CALCIUM 9.3  AST 935*  ALT 1,119*  ALKPHOS 162*  BILITOT 4.2*   ------------------------------------------------------------------------------------------------------------------  Cardiac Enzymes Recent Labs  Lab 07/31/17 0052 07/31/17 0645  TROPONINI 0.30* 0.33*   ------------------------------------------------------------------------------------------------------------------  RADIOLOGY:  Mr Brain Wo Contrast  Result Date: 07/31/2017 CLINICAL DATA:  Altered mental status EXAM: MRI HEAD WITHOUT CONTRAST TECHNIQUE: Multiplanar, multiecho pulse sequences of the brain and surrounding structures were obtained without intravenous contrast. COMPARISON:  Head CT 07/14/2017 FINDINGS: The study is degraded by motion, despite efforts to reduce this artifact, including utilization of motion-resistant MR sequences. The findings of the study are interpreted in the context of reduced sensitivity/specificity. BRAIN: There is no acute infarct, acute hemorrhage or mass effect. The midline structures are normal. 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 Multifocal white matter hyperintensity, most commonly due to chronic ischemic microangiopathy. Generalized atrophy without lobar  predilection. Susceptibility-sensitive sequences show no chronic microhemorrhage or superficial siderosis. VASCULAR: Major intracranial arterial and venous sinus flow voids are preserved. SKULL AND  UPPER CERVICAL SPINE: The visualized skull base, calvarium, upper cervical spine and extracranial soft tissues are normal. SINUSES/ORBITS: No fluid levels or advanced mucosal thickening. No mastoid or middle ear effusion. The orbits are normal. IMPRESSION: Severely motion degraded examination without acute intracranial abnormality. 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 Electronically Signed   By: Deatra Robinson M.D.   On: 07/31/2017 03:04   US Renal  Result Date: 07/30/2017 CLINICAL DATA:  Acute renal failure. EXAM: RENAL / URINARY TRACT ULTRASOUND COMPLETE COMPARISON:  None. FINDINGS: Right Kidney: Length: 10.6 cm. Echogenicity within normal limits. No mass or hydronephrosis visualized. Left Kidney: Length: 9.1 cm. Echogenicity within normal limits. A simple appearing cyst is seen in the upper pole measuring 5.0 cm. No mass or hydronephrosis visualized. Bladder: Appears normal for degree of bladder distention. Other: Multiple gallstones incidentally noted. IMPRESSION: No evidence of hydronephrosis or other acute findings. Cholelithiasis incidentally noted. Electronically Signed   By: Myles Rosenthal M.D.   On: 07/30/2017 18:33   Dg Chest Port 1 View  Result Date: 07/30/2017 CLINICAL DATA:  84 year old female with altered mental status, lethargy. Acute on chronic renal failure, thrombocytopenia, abnormal LFTs. EXAM: PORTABLE CHEST 1 VIEW COMPARISON:  07/14/2017 portable chest and earlier. FINDINGS: Portable AP upright view at 1546 hours. Continued low lung volumes. Stable cardiomegaly and mediastinal contours. Prior CABG. New veiling opacity at the right lateral lung base, although most resembles a small pleural effusion. No pneumothorax or pulmonary edema. The left lung  base appears clear. Negative visible bowel gas pattern. Stable visualized osseous structures. IMPRESSION: 1. New veiling opacity at the right lung base, but more resembles a small pleural effusion than pneumonia. 2. Otherwise stable chest: Chronic low lung volumes and cardiomegaly. Electronically Signed   By: Odessa Fleming M.D.   On: 07/30/2017 16:07   Dg Kayleen Memos  W/kub  Result Date: 07/31/2017 CLINICAL DATA:  Dysphagia. EXAM: UPPER GI SERIES WITH KUB TECHNIQUE: After obtaining a scout radiograph a routine upper GI series was performed using thin barium. FLUOROSCOPY TIME:  Fluoroscopy Time:  1 minutes 42 seconds Radiation Exposure Index (if provided by the fluoroscopic device): 27.6 mGy Number of Acquired Spot Images: 3 COMPARISON:  Ultrasound 07/30/2017. FINDINGS: Calcific densities noted right upper quadrant consistent with known gallstones. Pelvic calcifications consistent fibroids. Aortoiliac atherosclerotic vascular calcifications. Degenerative changes thoracolumbar spine. Total right hip replacement. This was a limited study as patient had difficulty swallowing. No evidence of esophageal obstruction. The stomach could not be evaluated due to the small amount of barium swallowed. IMPRESSION: Limited exam as the patient had difficulty swallowing. No evidence of esophageal obstruction. The stomach could not be evaluated due to the small amount of barium swallowed. Electronically Signed   By: Maisie Fus  Register   On: 07/31/2017 09:27   US Abdomen Limited Ruq  Result Date: 07/31/2017 CLINICAL DATA:  Abnormal LFTs EXAM: ULTRASOUND ABDOMEN LIMITED RIGHT UPPER QUADRANT COMPARISON:  None. FINDINGS: Gallbladder: Multiple calculi are identified within the gallbladder. Gallbladder wall is not thickened. No pericholecystic fluid is seen. Common bile duct: Diameter: 3.6 mm. Liver: Diffuse increased echogenicity is noted without focal mass. Portal vein is patent on color Doppler imaging with normal direction of blood flow  towards the liver. Incidental note is made of a small right pleural effusion. Minimal ascites is also noted. IMPRESSION: Cholelithiasis without complicating factors. Changes most consistent with fatty infiltration of the liver. Small right pleural effusion and minimal ascites. Electronically Signed   By: Eulah Pont.D.  On: 07/31/2017 09:39    ASSESSMENT AND PLAN:  84 year old female with past medical history of diabetes, hypertension, peripheral artery disease, recent admission for hyponatremia, chronic kidney disease stage III who presents to the hospital from direct admission due to altered mental status/encephalopathy and acute on chronic renal failure.  *Acute toxic metabolic encephalopathy  Compounded by dementia Resolved  Most likely secondary to acute on chronic renal failure, hyponatremia, and acute transaminitis  *AKI w/ CKD IV Minimal improvement Nephrology input appreciated Baseline creatinine 2 Continue to avoid nephrotoxic agents, Januvia stopped, renal ultrasound was unimpressive, IV fluids for rehydration, CMP in the morning  *Acute hyperkalemia Resolved Continue Veltassa  *Acute transaminitis  Noted worsening Most likely secondary to fatty liver and medication side effect-Zetia Liver ultrasound noted for gallstones without pathology/complication  Avoid hepatotoxic agents, discontinue Zetia, check CMP in the morning , gastroenterology consultation for expert opinion  *Acute thrombocytopenia  Stable Exact etiology unknown Abdominal ultrasound noted above  Oncology input appreciated-ITP/TTP doubted, outpatient follow-up recommended status post discharge  Continue to avoid anticoagulants   *Chronic diabetes mellitus type 2  Stable  Continue SSI Accu-Cheks per routine, discontinued Januvia/glipizide indefinitely given acute kidney injury with worsening chronic kidney disease stage IV  *Chronic dementia At baseline per husband Continue increase nursing  care as needed, aspiration/fall/skin care precautions while in house Speech therapy input appreciated-full liquid diet for now, dietary consulted  *Chronic hyperlipidemia, unspecified  Stable  Zetia stopped   *Chronic benign essential hypertension  Stable on Toprol   Disposition to home in the care of family 1 to 2 days barring any complications  All the records are reviewed and case discussed with Care Management/Social Workerr. Management plans discussed with the patient, family and they are in agreement.  CODE STATUS: DNR per spouse TOTAL TIME TAKING CARE OF THIS PATIENT: .     POSSIBLE D/C IN 1-2 DAYS, DEPENDING ON CLINICAL CONDITION.   Evelena Asa Bryne Lindon M.D on 08/01/2017   Between 7am to 6pm - Pager - [phone]  After 6pm go to [email or web address] - password [email or web address]  [facility]  Office  [phone]  CC: Primary care physician; [name], MD  Note: This dictation was prepared with Dragon dictation along with smaller phrase technology. Any transcriptional errors that result from this process are unintentional.

## 2017-08-01 NOTE — Progress Notes (Signed)
Family Meeting Note  Advance Directive:yes  Today a meeting took place with the Patient.  Patient is unable to participate due OV:PCHEKB capacity Dementia   The following clinical team members were present during this meeting:MD  The following were discussed:Patient's diagnosis: Dementia, encephalopathy, elevated liver function tests, obesity, thrombocytopenia, acute kidney injury, chronic kidney disease, Patient's progosis: Unable to determine and Goals for treatment: DNR  Additional follow-up to be provided: prn  Time spent during discussion:20 minutes  Bertrum Sol, MD

## 2017-08-02 DIAGNOSIS — Z888 Allergy status to other drugs, medicaments and biological substances status: Secondary | ICD-10-CM

## 2017-08-02 DIAGNOSIS — D696 Thrombocytopenia, unspecified: Secondary | ICD-10-CM

## 2017-08-02 DIAGNOSIS — Z87891 Personal history of nicotine dependence: Secondary | ICD-10-CM

## 2017-08-02 LAB — COMPREHENSIVE METABOLIC PANEL
ALT: 1405 U/L — ABNORMAL HIGH (ref 14–54)
ANION GAP: 19 — AB (ref 5–15)
AST: 1087 U/L — ABNORMAL HIGH (ref 15–41)
Albumin: 3.7 g/dL (ref 3.5–5.0)
Alkaline Phosphatase: 169 U/L — ABNORMAL HIGH (ref 38–126)
BUN: 116 mg/dL — ABNORMAL HIGH (ref 6–20)
CHLORIDE: 100 mmol/L — AB (ref 101–111)
CO2: 18 mmol/L — ABNORMAL LOW (ref 22–32)
CREATININE: 3.54 mg/dL — AB (ref 0.44–1.00)
Calcium: 9.6 mg/dL (ref 8.9–10.3)
GFR, EST AFRICAN AMERICAN: 12 mL/min — AB (ref >60)
GFR, EST NON AFRICAN AMERICAN: 11 mL/min — AB (ref >60)
Glucose, Bld: 152 mg/dL — ABNORMAL HIGH (ref 65–99)
POTASSIUM: 5.7 mmol/L — AB (ref 3.5–5.1)
Sodium: 137 mmol/L (ref 135–145)
Total Bilirubin: 5.2 mg/dL — ABNORMAL HIGH (ref 0.3–1.2)
Total Protein: 6 g/dL — ABNORMAL LOW (ref 6.5–8.1)

## 2017-08-02 LAB — URINALYSIS, ROUTINE W REFLEX MICROSCOPIC
BILIRUBIN URINE: NEGATIVE
Glucose, UA: NEGATIVE mg/dL
HGB URINE DIPSTICK: NEGATIVE
Ketones, ur: NEGATIVE mg/dL
NITRITE: NEGATIVE
PROTEIN: 100 mg/dL — AB
Specific Gravity, Urine: 1.015 (ref 1.005–1.030)
pH: 5 (ref 5.0–8.0)

## 2017-08-02 LAB — PROTIME-INR
INR: 2.81
Prothrombin Time: 29.4 seconds — ABNORMAL HIGH (ref 11.4–15.2)

## 2017-08-02 LAB — CBC
HEMATOCRIT: 37.2 % (ref 35.0–47.0)
HEMOGLOBIN: 11.7 g/dL — AB (ref 12.0–16.0)
MCH: 24.8 pg — ABNORMAL LOW (ref 26.0–34.0)
MCHC: 31.5 g/dL — ABNORMAL LOW (ref 32.0–36.0)
MCV: 78.7 fL — ABNORMAL LOW (ref 80.0–100.0)
PLATELETS: 63 10*3/uL — AB (ref 150–440)
RBC: 4.73 MIL/uL (ref 3.80–5.20)
RDW: 15.5 % — ABNORMAL HIGH (ref 11.5–14.5)
WBC: 12.3 10*3/uL — AB (ref 3.6–11.0)

## 2017-08-02 LAB — GLUCOSE, CAPILLARY
GLUCOSE-CAPILLARY: 200 mg/dL — AB (ref 65–99)
Glucose-Capillary: 157 mg/dL — ABNORMAL HIGH (ref 65–99)
Glucose-Capillary: 197 mg/dL — ABNORMAL HIGH (ref 65–99)
Glucose-Capillary: 185 mg/dL — ABNORMAL HIGH (ref 65–99)

## 2017-08-02 LAB — ACETAMINOPHEN LEVEL: Acetaminophen (Tylenol), Serum: <10 ug/mL — ABNORMAL LOW (ref 10–30)

## 2017-08-02 LAB — GAMMA GT: GGT: 439 U/L — AB (ref 7–50)

## 2017-08-02 LAB — CK: Total CK: 57 U/L (ref 38–234)

## 2017-08-02 LAB — FIBRIN DERIVATIVES D-DIMER (ARMC ONLY): Fibrin derivatives D-dimer (ARMC): >7500 ng/mL (FEU) — ABNORMAL HIGH (ref 0.00–499.00)

## 2017-08-02 MED ORDER — SODIUM CHLORIDE 0.45 % IV SOLN: INTRAVENOUS | Status: DC

## 2017-08-02 MED ORDER — SODIUM CHLORIDE 0.9 % IV SOLN
1.0000 g | INTRAVENOUS | Status: DC
  Administered 2017-08-02 – 2017-08-03 (×2): 1 g via INTRAVENOUS
  Filled 2017-08-02 (×2): qty 1

## 2017-08-02 MED ORDER — VITAMIN K1 10 MG/ML IJ SOLN
10.0000 mg | Freq: Every day | INTRAMUSCULAR | Status: DC
  Administered 2017-08-02 – 2017-08-03 (×2): 10 mg via SUBCUTANEOUS
  Filled 2017-08-02 (×2): qty 1

## 2017-08-02 MED ORDER — PATIROMER SORBITEX CALCIUM 8.4 G PO PACK
16.8000 g | PACK | Freq: Every day | ORAL | Status: DC
  Administered 2017-08-02: 16.8 g via ORAL
  Filled 2017-08-02 (×2): qty 2

## 2017-08-02 NOTE — Progress Notes (Signed)
[facility] - [place] at [facility]   PATIENT NAME: Elizabeth [name]    MR#:  [id number]  DATE OF BIRTH:  1929-10-21  SUBJECTIVE:  CHIEF COMPLAINT:  No chief complaint on file. Patient without complaint, husband at the bedside, they are interested in palliative care consultation, gastroneurology input appreciated, case discussed with nephrology  REVIEW OF SYSTEMS:  CONSTITUTIONAL: No fever, fatigue or weakness.  EYES: No blurred or double vision.  EARS, NOSE, AND THROAT: No tinnitus or ear pain.  RESPIRATORY: No cough, shortness of breath, wheezing or hemoptysis.  CARDIOVASCULAR: No chest pain, orthopnea, edema.  GASTROINTESTINAL: No nausea, vomiting, diarrhea or abdominal pain.  GENITOURINARY: No dysuria, hematuria.  ENDOCRINE: No polyuria, nocturia,  HEMATOLOGY: No anemia, easy bruising or bleeding SKIN: No rash or lesion. MUSCULOSKELETAL: No joint pain or arthritis.   NEUROLOGIC: No tingling, numbness, weakness.  PSYCHIATRY: No anxiety or depression.   ROS  DRUG ALLERGIES:   Allergies  Allergen Reactions  . Meperidine Nausea Only    VITALS:  Blood pressure 134/73, pulse 74, temperature 97.7 F (36.5 C), resp. rate 20, height 5\' 4"  (1.626 m), weight 71 kg (156 lb 8.4 oz), SpO2 100 %.  PHYSICAL EXAMINATION:  GENERAL:  84 y.o.-year-old patient lying in the bed with no acute distress.  EYES: Pupils equal, round, reactive to light and accommodation. No scleral icterus. Extraocular muscles intact.  HEENT: Head atraumatic, normocephalic. Oropharynx and nasopharynx clear.  NECK:  Supple, no jugular venous distention. No thyroid enlargement, no tenderness.  LUNGS: Normal breath sounds bilaterally, no wheezing, rales,rhonchi or crepitation. No use of accessory muscles of respiration.  CARDIOVASCULAR: S1, S2 normal. No murmurs, rubs, or gallops.  ABDOMEN: Soft, nontender, nondistended. Bowel sounds present. No organomegaly or mass.  EXTREMITIES: No pedal edema,  cyanosis, or clubbing.  NEUROLOGIC: Cranial nerves II through XII are intact. Muscle strength 5/5 in all extremities. Sensation intact. Gait not checked.  PSYCHIATRIC: The patient is alert and oriented x 3.  SKIN: No obvious rash, lesion, or ulcer.   Physical Exam LABORATORY PANEL:   CBC Recent Labs  Lab 08/02/17 0608  WBC 12.3*  HGB 11.7*  HCT 37.2  PLT 63*   ------------------------------------------------------------------------------------------------------------------  Chemistries  Recent Labs  Lab 08/02/17 0608  NA 137  K 5.7*  CL 100*  CO2 18*  GLUCOSE 152*  BUN 116*  CREATININE 3.54*  CALCIUM 9.6  AST 1,087*  ALT 1,405*  ALKPHOS 169*  BILITOT 5.2*   ------------------------------------------------------------------------------------------------------------------  Cardiac Enzymes Recent Labs  Lab 07/31/17 0052 07/31/17 0645  TROPONINI 0.30* 0.33*   ------------------------------------------------------------------------------------------------------------------  RADIOLOGY:  No results found.  ASSESSMENT AND PLAN:  84 year old female with past medical history of diabetes, hypertension, peripheral artery disease, recent admission for hyponatremia, chronic kidney disease stage III who presents to the hospital from direct admission due to altered mental status/encephalopathy and acute on chronic renal failure.  *Acute toxic metabolic encephalopathy  Compounded by dementia Resolved  Most likely secondary to acute on chronic renal failure, hyponatremia, and acute liver failure  *AKI w/ CKD IV Worsening noted Nephrology input appreciated Baseline creatinine 2 Continue to avoid nephrotoxic agents, Januvia stopped, renal ultrasound was unimpressive, stop IVFs, CMP daily  *Acute hyperkalemia Continue Veltassa  *Acute liver failure  Noted worsening Most likely secondary to fatty liver and medication side effect-Zetia/Niacin/Colchicine Liver  ultrasound noted for gallstones without pathology/complication  Gastroenterology input appreciated  *Acute thrombocytopenia  Stable Exact etiology unknown Abdominal ultrasound noted above  Oncology input appreciated-ITP/TTP doubted, outpatient follow-up recommended  status post discharge  Continue to avoid anticoagulants  *Chronic diabetes mellitus type 2  Stable  Continue SSI Accu-Cheks per routine, discontinued Januvia/glipizide indefinitely given acute kidney injury with worsening chronic kidney disease stage IV  *Chronic dementia At baseline per husband Continue increase nursing care as needed, aspiration/fall/skin care precautions while in house Speech therapy input appreciated-full liquid diet for now, dietary consulted  *Chronic hyperlipidemia, unspecified  Stable  Zetia stopped  *Chronic benign essential hypertension  Stable on Toprol  Disposition pending clinical course  Palliative care consulted given multiorgan system dysfunction/failure   All the records are reviewed and case discussed with Care Management/Social Workerr. Management plans discussed with the patient, family and they are in agreement.  CODE STATUS: dnr  TOTAL TIME TAKING CARE OF THIS PATIENT: 45 minutes.     POSSIBLE D/C IN 1-33 DAYS, DEPENDING ON CLINICAL CONDITION.   Evelena Asa Blondie Riggsbee M.D on 08/02/2017   Between 7am to 6pm - Pager - [phone]  After 6pm go to [email or web address] - password [email or web address]  [facility] [place] [facility]  Office  [phone]  CC: Primary care physician; [name], MD  Note: This dictation was prepared with Dragon dictation along with smaller phrase technology. Any transcriptional errors that result from this process are unintentional.

## 2017-08-02 NOTE — Progress Notes (Signed)
[facility]  [phone] [phone]  ID: Elizabeth [name] OB: 04/25/1929  MR#: [id number]  IRS#:[id number]  Patient Care Team: [name], MD as PCP - General (Internal Medicine)  CHIEF COMPLAINT: Thrombocytopenia.  INTERVAL HISTORY: No appreciable change.  Patient appears to be resting in bed comfortably, son at bedside.  Patient voices no complaints.  REVIEW OF SYSTEMS:   Review of Systems  Constitutional: Positive for malaise/fatigue. Negative for fever and weight loss.  Respiratory: Negative.  Negative for cough.   Cardiovascular: Positive for leg swelling. Negative for chest pain.  Gastrointestinal: Negative.  Negative for abdominal pain.  Genitourinary: Negative.  Negative for dysuria.  Musculoskeletal: Negative.  Negative for back pain.  Neurological: Positive for weakness. Negative for focal weakness.  Endo/Heme/Allergies: Does not bruise/bleed easily.  Psychiatric/Behavioral: Negative.  The patient is not nervous/anxious.     As per HPI. Otherwise, a complete review of systems is negative.  PAST MEDICAL HISTORY: Past Medical History:  Diagnosis Date  . Diabetes mellitus without complication (HCC)   . Hyperlipidemia   . Hypertension   . Peripheral arterial disease (HCC)     PAST SURGICAL HISTORY: Past Surgical History:  Procedure Laterality Date  . CARDIAC SURGERY     triple bypass  . FEMORAL-FEMORAL BYPASS GRAFT    . JOINT REPLACEMENT     right hip  . LEG SURGERY Left    leg bypass    FAMILY HISTORY: Family History  Problem Relation Age of Onset  . Diabetes Mother     ADVANCED DIRECTIVES (Y/N):  @ADVDIR @  HEALTH MAINTENANCE: Social History   Tobacco Use  . Smoking status: Former Smoker    Types: Cigarettes  . Smokeless tobacco: Never Used  Substance Use Topics  . Alcohol use: No  . Drug use: No     Colonoscopy:  PAP:  Bone density:  Lipid panel:  Allergies  Allergen Reactions  . Meperidine  Nausea Only    Current Facility-Administered Medications  Medication Dose Route Frequency Provider Last Rate Last Dose  . aspirin EC tablet 81 mg  81 mg Oral Daily Houston Siren, MD   81 mg at 08/02/17 1000  . cefTRIAXone (ROCEPHIN) 1 g in sodium chloride 0.9 % 100 mL IVPB  1 g Intravenous Q24H Salary, Evelena Asa, MD   Stopped at 08/02/17 1034  . feeding supplement (ENSURE ENLIVE) (ENSURE ENLIVE) liquid 237 mL  237 mL Oral BID BM Salary, Montell D, MD      . insulin aspart (novoLOG) injection 0-5 Units  0-5 Units Subcutaneous QHS Houston Siren, MD   2 Units at 07/30/17 2216  . insulin aspart (novoLOG) injection 0-9 Units  0-9 Units Subcutaneous TID WC Houston Siren, MD   2 Units at 08/02/17 1658  . MEDLINE mouth rinse  15 mL Mouth Rinse BID Salary, Montell D, MD   15 mL at 08/02/17 1003  . metoprolol succinate (TOPROL-XL) 24 hr tablet 50 mg  50 mg Oral Daily Houston Siren, MD   50 mg at 08/02/17 1000  . multivitamin with minerals tablet 1 tablet  1 tablet Oral Daily Salary, Montell D, MD   1 tablet at 08/02/17 1001  . ondansetron (ZOFRAN) tablet 4 mg  4 mg Oral Q6H PRN Houston Siren, MD       Or  . ondansetron (ZOFRAN) injection 4 mg  4 mg Intravenous Q6H PRN Houston Siren, MD      . patiromer [facility]) packet 16.8  g  16.8 g Oral Daily Salary, Montell D, MD   16.8 g at 08/02/17 0931  . phytonadione (VITAMIN K) SQ injection 10 mg  10 mg Subcutaneous Daily Wyline Mood, MD   10 mg at 08/02/17 1156    OBJECTIVE: Vitals:   08/02/17 1000 08/02/17 1126  BP: (!) 148/82 134/73  Pulse: 75 74  Resp:    Temp:  97.7 F (36.5 C)  SpO2:  100%     Body mass index is 26.87 kg/m.    ECOG FS:4 - Bedbound  General: Ill-appearing, no acute distress. Eyes: Pink conjunctiva, anicteric sclera. Lungs: Clear to auscultation bilaterally. Heart: Regular rate and rhythm. No rubs, murmurs, or gallops. Abdomen: Soft, nontender, nondistended. No organomegaly noted, normoactive bowel  sounds. Musculoskeletal: No edema, cyanosis, or clubbing. Neuro: Alert. Cranial nerves grossly intact. Skin: No rashes or petechiae noted. Psych: Normal affect.  LAB RESULTS:  Lab Results  Component Value Date   NA 137 08/02/2017   K 5.7 (H) 08/02/2017   CL 100 (L) 08/02/2017   CO2 18 (L) 08/02/2017   GLUCOSE 152 (H) 08/02/2017   BUN 116 (H) 08/02/2017   CREATININE 3.54 (H) 08/02/2017   CALCIUM 9.6 08/02/2017   PROT 6.0 (L) 08/02/2017   ALBUMIN 3.7 08/02/2017   AST 1,087 (H) 08/02/2017   ALT 1,405 (H) 08/02/2017   ALKPHOS 169 (H) 08/02/2017   BILITOT 5.2 (H) 08/02/2017   GFRNONAA 11 (L) 08/02/2017   GFRAA 12 (L) 08/02/2017    Lab Results  Component Value Date   WBC 12.3 (H) 08/02/2017   NEUTROABS 9.6 (H) 08/01/2017   HGB 11.7 (L) 08/02/2017   HCT 37.2 08/02/2017   MCV 78.7 (L) 08/02/2017   PLT 63 (L) 08/02/2017     STUDIES: Ct Head Wo Contrast  Result Date: 07/14/2017 CLINICAL DATA:  Altered mental status.  Status post CPR. EXAM: CT HEAD WITHOUT CONTRAST TECHNIQUE: Contiguous axial images were obtained from the base of the skull through the vertex without intravenous contrast. COMPARISON:  None. FINDINGS: Brain: Diffusely enlarged ventricles and subarachnoid spaces. Patchy white matter low density in both cerebral hemispheres. Old right occipital and left posterior parietal infarcts. Small, old bilateral cerebellar hemisphere infarcts. No intracranial hemorrhage, mass lesion or CT evidence of acute infarction. Vascular: No hyperdense vessel or unexpected calcification. Skull: Small right frontal bone exostosis/calcified cephalohematoma. Otherwise, unremarkable skull. Sinuses/Orbits: Unremarkable. Other: None. IMPRESSION: 1. No acute abnormality. 2. Moderate diffuse cerebral and cerebellar atrophy. 3. Moderate chronic small vessel white matter ischemic changes in both cerebral hemispheres. 4. Old infarcts, as described above. Electronically Signed   By: Beckie Salts M.D.    On: 07/14/2017 18:31   Mr Brain Wo Contrast  Result Date: 07/31/2017 CLINICAL DATA:  Altered mental status EXAM: MRI HEAD WITHOUT CONTRAST TECHNIQUE: Multiplanar, multiecho pulse sequences of the brain and surrounding structures were obtained without intravenous contrast. COMPARISON:  Head CT 07/14/2017 FINDINGS: The study is degraded by motion, despite efforts to reduce this artifact, including utilization of motion-resistant MR sequences. The findings of the study are interpreted in the context of reduced sensitivity/specificity. BRAIN: There is no acute infarct, acute hemorrhage or mass effect. The midline structures are normal. 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 Multifocal white matter hyperintensity, most commonly due to chronic ischemic microangiopathy. Generalized atrophy without lobar predilection. Susceptibility-sensitive sequences show no chronic microhemorrhage or superficial siderosis. VASCULAR: Major intracranial arterial and venous sinus flow voids are preserved. SKULL AND UPPER CERVICAL SPINE: The visualized skull base, calvarium, upper cervical spine  and extracranial soft tissues are normal. SINUSES/ORBITS: No fluid levels or advanced mucosal thickening. No mastoid or middle ear effusion. The orbits are normal. IMPRESSION: Severely motion degraded examination without acute intracranial abnormality. 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 Electronically Signed   By: Deatra Robinson M.D.   On: 07/31/2017 03:04   US Renal  Result Date: 07/30/2017 CLINICAL DATA:  Acute renal failure. EXAM: RENAL / URINARY TRACT ULTRASOUND COMPLETE COMPARISON:  None. FINDINGS: Right Kidney: Length: 10.6 cm. Echogenicity within normal limits. No mass or hydronephrosis visualized. Left Kidney: Length: 9.1 cm. Echogenicity within normal limits. A simple appearing cyst is seen in the upper pole measuring 5.0 cm. No mass or hydronephrosis visualized. Bladder: Appears  normal for degree of bladder distention. Other: Multiple gallstones incidentally noted. IMPRESSION: No evidence of hydronephrosis or other acute findings. Cholelithiasis incidentally noted. Electronically Signed   By: Myles Rosenthal M.D.   On: 07/30/2017 18:33   US Carotid Bilateral  Result Date: 07/15/2017 CLINICAL DATA:  Syncope. Hypertension, coronary artery disease, hyperlipidemia, diabetes. EXAM: BILATERAL CAROTID DUPLEX ULTRASOUND TECHNIQUE: Wallace Cullens scale imaging, color Doppler and duplex ultrasound was performed of bilateral carotid and vertebral arteries in the neck. COMPARISON:  None. TECHNIQUE: Quantification of carotid stenosis is based on velocity parameters that correlate the residual internal carotid diameter with NASCET-based stenosis levels, using the diameter of the distal internal carotid lumen as the denominator for stenosis measurement. The following velocity measurements were obtained: PEAK SYSTOLIC/END DIASTOLIC RIGHT ICA:                     64/8cm/sec CCA:                     62/5cm/sec SYSTOLIC ICA/CCA RATIO:  0.9 ECA:                     110cm/sec LEFT ICA:                     81/12cm/sec CCA:                     54/6cm/sec SYSTOLIC ICA/CCA RATIO:  1 ECA:                     160cm/sec FINDINGS: RIGHT CAROTID ARTERY: Mild irregular plaque in the distal common carotid artery and bulb, proximal internal and external carotid arteries. No high-grade stenosis. Normal waveforms and color Doppler signal. RIGHT VERTEBRAL ARTERY:  Normal flow direction and waveform. LEFT CAROTID ARTERY: Mild irregular plaque through the length of the common carotid artery, in the bulb and proximal ICA. No high-grade stenosis. Normal waveforms and color Doppler signal. LEFT VERTEBRAL ARTERY: Normal flow direction and waveform. IMPRESSION: 1. Extensive nonocclusive bilateral carotid plaque resulting in less than 50% diameter stenosis. 2.  Antegrade bilateral vertebral arterial flow. Electronically Signed   By: Corlis Leak  M.D.   On: 07/15/2017 10:51   Dg Chest Port 1 View  Result Date: 07/30/2017 CLINICAL DATA:  84 year old female with altered mental status, lethargy. Acute on chronic renal failure, thrombocytopenia, abnormal LFTs. EXAM: PORTABLE CHEST 1 VIEW COMPARISON:  07/14/2017 portable chest and earlier. FINDINGS: Portable AP upright view at 1546 hours. Continued low lung volumes. Stable cardiomegaly and mediastinal contours. Prior CABG. New veiling opacity at the right lateral lung base, although most resembles a small pleural effusion. No pneumothorax or pulmonary edema. The left lung base appears clear. Negative  visible bowel gas pattern. Stable visualized osseous structures. IMPRESSION: 1. New veiling opacity at the right lung base, but more resembles a small pleural effusion than pneumonia. 2. Otherwise stable chest: Chronic low lung volumes and cardiomegaly. Electronically Signed   By: Odessa Fleming M.D.   On: 07/30/2017 16:07   Dg Chest Port 1 View  Result Date: 07/14/2017 CLINICAL DATA:  Status post CPR. EXAM: PORTABLE CHEST 1 VIEW COMPARISON:  10/10/2009. FINDINGS: Poor inspiration. Minimal patchy opacity at both medial lung bases. Otherwise, clear lungs. Stable post CABG changes. Thoracic spine degenerative changes. Stable mildly enlarged cardiac silhouette. IMPRESSION: Poor inspiration with minimal patchy bibasilar atelectasis or aspiration pneumonitis. Electronically Signed   By: Beckie Salts M.D.   On: 07/14/2017 18:26   Dg Ugi  W/kub  Result Date: 07/31/2017 CLINICAL DATA:  Dysphagia. EXAM: UPPER GI SERIES WITH KUB TECHNIQUE: After obtaining a scout radiograph a routine upper GI series was performed using thin barium. FLUOROSCOPY TIME:  Fluoroscopy Time:  1 minutes 42 seconds Radiation Exposure Index (if provided by the fluoroscopic device): 27.6 mGy Number of Acquired Spot Images: 3 COMPARISON:  Ultrasound 07/30/2017. FINDINGS: Calcific densities noted right upper quadrant consistent with known gallstones.  Pelvic calcifications consistent fibroids. Aortoiliac atherosclerotic vascular calcifications. Degenerative changes thoracolumbar spine. Total right hip replacement. This was a limited study as patient had difficulty swallowing. No evidence of esophageal obstruction. The stomach could not be evaluated due to the small amount of barium swallowed. IMPRESSION: Limited exam as the patient had difficulty swallowing. No evidence of esophageal obstruction. The stomach could not be evaluated due to the small amount of barium swallowed. Electronically Signed   By: Maisie Fus  Register   On: 07/31/2017 09:27   US Abdomen Limited Ruq  Result Date: 07/31/2017 CLINICAL DATA:  Abnormal LFTs EXAM: ULTRASOUND ABDOMEN LIMITED RIGHT UPPER QUADRANT COMPARISON:  None. FINDINGS: Gallbladder: Multiple calculi are identified within the gallbladder. Gallbladder wall is not thickened. No pericholecystic fluid is seen. Common bile duct: Diameter: 3.6 mm. Liver: Diffuse increased echogenicity is noted without focal mass. Portal vein is patent on color Doppler imaging with normal direction of blood flow towards the liver. Incidental note is made of a small right pleural effusion. Minimal ascites is also noted. IMPRESSION: Cholelithiasis without complicating factors. Changes most consistent with fatty infiltration of the liver. Small right pleural effusion and minimal ascites. Electronically Signed   By: Alcide Clever M.D.   On: 07/31/2017 09:39    ASSESSMENT: Thrombocytopenia.  PLAN:    1.  Thrombocytopenia: Slightly decreased today, but essentially unchanged.  Suspect underlying DIC secondary to acute liver failure.  Patient does not require FFP or platelet transfusion today.  Continue to monitor daily platelet count, fibrinogen, and PT/INR.   Will follow.  Jeralyn Ruths, MD   08/02/2017 5:40 PM

## 2017-08-02 NOTE — Progress Notes (Signed)
Wyline Mood , MD [address], [address], [place], [place], [address], [address], [place], [place], [address] Phone: [phone]  Fax: [phone]   KENNEDEY [name] is being followed for abnormal LFT's  Day 1 of follow up   Subjective: No new complaints    Objective: Vital signs in last 24 hours: Vitals:   08/01/17 0334 08/01/17 1320 08/01/17 2031 08/02/17 0508  BP: (!) 154/90 140/78 138/75 (!) 150/80  Pulse: 97 89 80 79  Resp: 20 16 18 20   Temp: 98.3 F (36.8 C) 97.6 F (36.4 C) 98.4 F (36.9 C) (!) 97.3 F (36.3 C)  TempSrc: Axillary Oral Oral Oral  SpO2: 100% 100% 100% 100%  Weight:      Height:       Weight change:   Intake/Output Summary (Last 24 hours) at 08/02/2017 0831 Last data filed at 08/02/2017 2263 Gross per 24 hour  Intake 615.5 ml  Output 750 ml  Net -134.5 ml     Exam: Heart:: Regular rate and rhythm, S1S2 present or without murmur or extra heart sounds Lungs: normal, clear to auscultation and clear to auscultation and percussion Abdomen: soft, nontender, normal bowel sounds   Lab Results: @LABTEST2 @ Micro Results: Recent Results (from the past 240 hour(s))  Urine Culture     Status: Abnormal (Preliminary result)   Collection Time: 08/01/17  5:53 AM  Result Value Ref Range Status   Specimen Description   Final    URINE, RANDOM Performed at [facility], [address], [place], [place] [address]    Special Requests   Final    NONE Performed at [facility], [address] [place], [place] [address]    Culture 40,000 COLONIES/mL GRAM NEGATIVE RODS (A)  Final   Report Status PENDING  Incomplete   Studies/Results: Dg Ugi  W/kub  Result Date: 07/31/2017 CLINICAL DATA:  Dysphagia. EXAM: UPPER GI SERIES WITH KUB TECHNIQUE: After obtaining a scout radiograph a routine upper GI series was performed using thin barium. FLUOROSCOPY TIME:  Fluoroscopy Time:  1 minutes 42 seconds Radiation Exposure Index (if  provided by the fluoroscopic device): 27.6 mGy Number of Acquired Spot Images: 3 COMPARISON:  Ultrasound 07/30/2017. FINDINGS: Calcific densities noted right upper quadrant consistent with known gallstones. Pelvic calcifications consistent fibroids. Aortoiliac atherosclerotic vascular calcifications. Degenerative changes thoracolumbar spine. Total right hip replacement. This was a limited study as patient had difficulty swallowing. No evidence of esophageal obstruction. The stomach could not be evaluated due to the small amount of barium swallowed. IMPRESSION: Limited exam as the patient had difficulty swallowing. No evidence of esophageal obstruction. The stomach could not be evaluated due to the small amount of barium swallowed. Electronically Signed   By: Maisie Fus  Register   On: 07/31/2017 09:27   US Abdomen Limited Ruq  Result Date: 07/31/2017 CLINICAL DATA:  Abnormal LFTs EXAM: ULTRASOUND ABDOMEN LIMITED RIGHT UPPER QUADRANT COMPARISON:  None. FINDINGS: Gallbladder: Multiple calculi are identified within the gallbladder. Gallbladder wall is not thickened. No pericholecystic fluid is seen. Common bile duct: Diameter: 3.6 mm. Liver: Diffuse increased echogenicity is noted without focal mass. Portal vein is patent on color Doppler imaging with normal direction of blood flow towards the liver. Incidental note is made of a small right pleural effusion. Minimal ascites is also noted. IMPRESSION: Cholelithiasis without complicating factors. Changes most consistent with fatty infiltration of the liver. Small right pleural effusion and minimal ascites. Electronically Signed   By: Eulah Pont.D.  On: 07/31/2017 09:39   Medications: I have reviewed the patient's current medications. Scheduled Meds: . aspirin EC  81 mg Oral Daily  . feeding supplement (ENSURE ENLIVE)  237 mL Oral BID BM  . insulin aspart  0-5 Units Subcutaneous QHS  . insulin aspart  0-9 Units Subcutaneous TID WC  . mouth rinse  15 mL Mouth  Rinse BID  . metoprolol succinate  50 mg Oral Daily  . multivitamin with minerals  1 tablet Oral Daily  . patiromer  16.8 g Oral Daily   Continuous Infusions: . sodium chloride    . cefTRIAXone (ROCEPHIN)  IV     PRN Meds:.ondansetron **OR** ondansetron (ZOFRAN) IV   Assessment: Active Problems:   Acute renal failure (ARF) (HCC)   Elizabeth [name] 84 y.o. female with a history of DM,HTN,HLP,CKD,CAD,ischemic cardiomyopathy admotted with poor oral intake . GI consulted for elevated transaminases. Also has thrombocytopenia , elevated INR.   She has acute liver failure based on elevated INR, abnormal LFT's. Altered mental status could be either from metabolic encephelopathy or from hepatic encephelopathy .   Colchicine is metabolized in the liver and excreted by the kidney - with worsening renal and hepatic function may be causing toxicity . Colchicine has caused acute liver injury and can cause rabdomyolysis.   Plan: 1. Stop colchicine, zetia , niacin at this time.All can cause liver test abnormalities  2. Check EBV,HSV,CMV, Acute hep B,C viral load,USG dopplers ( I have ordered these tests), check tylenol levels.   3. Vitamin K 10 mg S.C once daily for 3 doses to correct any nutritional deficiencies causing  Elevated INR.   4. Usually ischemic hepatitis resolves rapidly with restoration of blood pressure- in her case still rising. Need to consider other differentials such as drug induced liver injury and acute viral hepatitis  Discussed plan with family and Dr Katheren Shams  LOS: 3 days   Wyline Mood, MD 08/02/2017, 8:31 AM

## 2017-08-02 NOTE — Progress Notes (Signed)
Pt.'s appetite is very poor, pt is still lethargic and only alert to self. RN and nursing staff is rotating pt every two hours to prevent further pressure injuries. Pt does have a stage II on her right buttocks area that was present on admission, site was cleaned and dressing was changed. RN and nursing staff has attempted to give pt fluids and full liquid diet today but pt has not been awake long enough to take in any intake. RN has encouraged family and visitors to give pt fluids and full liquid diet while awake and to make sure head of bed is elevated to 90 degrees. Family has been updated about pt.'s care plan. RN will continue to monitor pt closely.   Leigh Blas [employer]

## 2017-08-02 NOTE — Progress Notes (Signed)
PT Cancellation Note  Patient Details Name: Elizabeth [name] MRN: [id number] DOB: [birth year]   Cancelled Treatment:    Reason Eval/Treat Not Completed: Medical issues which prohibited therapy.  Several labs are indicating pt is not ready for therapy but will retry as pt and time allow for mobility assessment.   Ivar Drape 08/02/2017, 8:49 AM   Samul Dada, PT MS Acute Rehab Dept. Number: [facility] [id number] and [facility] Dt [phone]

## 2017-08-02 NOTE — Progress Notes (Signed)
Family Meeting Note  Advance Directive:yes  Today a meeting took place with the Patient, spouse.  Patient is able to participate  The following clinical team members were present during this meeting:MD  The following were discussed:Patient's diagnosis: Multiorgan system dysfunction, Patient's progosis: Unable to determine and Goals for treatment: DNR  Additional follow-up to be provided: prn  Time spent during discussion:20 minutes  Bertrum Sol, MD

## 2017-08-02 NOTE — Progress Notes (Signed)
[facility] [place], [place] 08/02/17  Subjective:   Patient remains critically ill.   Lab results from this morning show slight worsening of creatinine to 3.54, BUN 116 and potassium is high at 5.7 Bicarbonate slightly lower.  LFTs continue to remain elevated.  Patient is coagulopathic with INR of 2.81    Objective:  Vital signs in last 24 hours:  Temp:  [97.3 F (36.3 C)-98.4 F (36.9 C)] 97.7 F (36.5 C) (06/15 1126) Pulse Rate:  [74-89] 74 (06/15 1126) Resp:  [16-20] 20 (06/15 0508) BP: (134-150)/(73-82) 134/73 (06/15 1126) SpO2:  [100 %] 100 % (06/15 1126)  Weight change:  Filed Weights   07/30/17 1530  Weight: 71 kg (156 lb 8.4 oz)    Intake/Output:    Intake/Output Summary (Last 24 hours) at 08/02/2017 1139 Last data filed at 08/02/2017 0828 Gross per 24 hour  Intake 615.5 ml  Output 750 ml  Net -134.5 ml     Physical Exam: General:  Ill-appearing, laying in the bed  HEENT  moist oral mucous membranes  Neck  supple  Pulm/lungs  normal breathing effort, mild basilar crackles  CVS/Heart  irregular, no rub  Abdomen:   Soft, nontender  Extremities:  No significant edema  Neurologic:  Alert, able to follow commands,   Skin:  No acute rashes          Basic Metabolic Panel:  Recent Labs  Lab 07/30/17 1613 07/31/17 0645 08/01/17 0544 08/02/17 0608  NA  --  130* 135 137  K  --  5.5* 5.3* 5.7*  CL  --  93* 100* 100*  CO2  --  22 18* 18*  GLUCOSE  --  274* 162* 152*  BUN  --  99* 106* 116*  CREATININE 3.95* 3.58* 3.40* 3.54*  CALCIUM  --  9.3 9.3 9.6     CBC: Recent Labs  Lab 07/30/17 1613 07/31/17 0645 08/01/17 0544 08/02/17 0608  WBC 9.8 10.0 10.3 12.3*  NEUTROABS  --   --  9.6*  --   HGB 12.5 10.9* 11.0* 11.7*  HCT 37.9 33.4* 34.3* 37.2  MCV 77.8* 76.6* 76.8* 78.7*  PLT 85* 72* 73* 63*     No results found for: HEPBSAG, HEPBSAB, HEPBIGM    Microbiology:  Recent Results (from the past 240 hour(s))  Urine  Culture     Status: Abnormal (Preliminary result)   Collection Time: 08/01/17  5:53 AM  Result Value Ref Range Status   Specimen Description   Final    URINE, RANDOM Performed at [facility], [address], [place], [place] [address]    Special Requests   Final    NONE Performed at [facility], [address], [place], [place] [address]    Culture 40,000 COLONIES/mL ESCHERICHIA COLI (A)  Final   Report Status PENDING  Incomplete    Coagulation Studies: Recent Labs    07/31/17 1620 08/02/17 1001  LABPROT 28.3* 29.4*  INR 2.68 2.81    Urinalysis: No results for input(s): COLORURINE, LABSPEC, PHURINE, GLUCOSEU, HGBUR, BILIRUBINUR, KETONESUR, PROTEINUR, UROBILINOGEN, NITRITE, LEUKOCYTESUR in the last 72 hours.  Invalid input(s): APPERANCEUR    Imaging: No results found.   Medications:   . sodium chloride    . cefTRIAXone (ROCEPHIN)  IV Stopped (08/02/17 1034)   . aspirin EC  81 mg Oral Daily  . feeding supplement (ENSURE ENLIVE)  237 mL Oral BID BM  . insulin aspart  0-5 Units Subcutaneous QHS  . insulin aspart  0-9 Units Subcutaneous TID WC  . mouth rinse  15 mL Mouth Rinse BID  . metoprolol succinate  50 mg Oral Daily  . multivitamin with minerals  1 tablet Oral Daily  . patiromer  16.8 g Oral Daily  . phytonadione  10 mg Subcutaneous Daily   ondansetron **OR** ondansetron (ZOFRAN) IV  Assessment/ Plan:  84 y.o. African-American female with  diabetes, hypertension, hyperlipidemia, chronic kidney disease stage IV, peripheral vascular disease, triple-vessel CABG, chronic systolic congestive heart failure with LVEF 20 to 25% from echo in May 2019, history of femoral bypass, who was admitted to [facility] directly from PMD office on 07/30/2017 for evaluation of acute renal failure and elevated liver enzymes.   1.  Acute kidney injury 2.  Chronic kidney disease stage IV, baseline creatinine 2.17/GFR 23 from 07/16/2017 3.  Severe hyperkalemia 4.   Metabolic acidosis/ Lactic acidosis 5.  Acute hepatitis/transaminitis 6.  Altered mental status 7.  Thrombocytopenia 8.  Chronic systolic congestive heart failure with LVEF 20 to 25% from May 2019  No schistocytes noted on peripheral smear.  Right upper quadrant ultrasound shows cholelithiasis, fatty infiltration of liver. LFTs are worse. Coagulopathic with INR 2.81 Patient's mental status is not at baseline but she is able to answer most questions and able to recognize family members. Renal ultrasound without any evidence of hydronephrosis.  Potassium, BUN level are worse today.  If this trend continues, she may need hemodialysis support.  We will continue to follow closely     LOS: 3 Leigh Blas Thedore Mins 6/15/201911:39 AM  [facility] [place], [place] [phone]  Note: This note was prepared with Dragon dictation. Any transcription errors are unintentional

## 2017-08-03 ENCOUNTER — Inpatient Hospital Stay: Payer: Medicare Other

## 2017-08-03 LAB — URINE CULTURE

## 2017-08-03 LAB — COMPREHENSIVE METABOLIC PANEL
ALBUMIN: 3.8 g/dL (ref 3.5–5.0)
ALT: 1160 U/L — AB (ref 14–54)
AST: 500 U/L — AB (ref 15–41)
Alkaline Phosphatase: 153 U/L — ABNORMAL HIGH (ref 38–126)
Anion gap: 20 — ABNORMAL HIGH (ref 5–15)
BILIRUBIN TOTAL: 4.9 mg/dL — AB (ref 0.3–1.2)
BUN: 141 mg/dL — AB (ref 6–20)
CO2: 19 mmol/L — ABNORMAL LOW (ref 22–32)
CREATININE: 3.79 mg/dL — AB (ref 0.44–1.00)
Calcium: 9.4 mg/dL (ref 8.9–10.3)
Chloride: 100 mmol/L — ABNORMAL LOW (ref 101–111)
GFR calc Af Amer: 11 mL/min — ABNORMAL LOW (ref >60)
GFR calc non Af Amer: 10 mL/min — ABNORMAL LOW (ref >60)
GLUCOSE: 199 mg/dL — AB (ref 65–99)
POTASSIUM: 5.8 mmol/L — AB (ref 3.5–5.1)
Sodium: 139 mmol/L (ref 135–145)
TOTAL PROTEIN: 6 g/dL — AB (ref 6.5–8.1)

## 2017-08-03 LAB — GLUCOSE, CAPILLARY: Glucose-Capillary: 184 mg/dL — ABNORMAL HIGH (ref 65–99)

## 2017-08-03 LAB — CBC
HEMATOCRIT: 35.5 % (ref 35.0–47.0)
Hemoglobin: 11.2 g/dL — ABNORMAL LOW (ref 12.0–16.0)
MCH: 24.8 pg — ABNORMAL LOW (ref 26.0–34.0)
MCHC: 31.6 g/dL — ABNORMAL LOW (ref 32.0–36.0)
MCV: 78.3 fL — ABNORMAL LOW (ref 80.0–100.0)
PLATELETS: 54 10*3/uL — AB (ref 150–440)
RBC: 4.53 MIL/uL (ref 3.80–5.20)
RDW: 15.8 % — AB (ref 11.5–14.5)
WBC: 13.6 10*3/uL — ABNORMAL HIGH (ref 3.6–11.0)

## 2017-08-03 MED ORDER — LORAZEPAM 2 MG/ML PO CONC
1.0000 mg | ORAL | Status: DC | PRN
  Filled 2017-08-03: qty 0.5

## 2017-08-03 MED ORDER — POLYVINYL ALCOHOL 1.4 % OP SOLN
1.0000 [drp] | Freq: Four times a day (QID) | OPHTHALMIC | Status: DC | PRN
  Filled 2017-08-03: qty 15

## 2017-08-03 MED ORDER — DIPHENHYDRAMINE HCL 50 MG/ML IJ SOLN: 12.5000 mg | INTRAMUSCULAR | Status: DC | PRN

## 2017-08-03 MED ORDER — MORPHINE SULFATE (CONCENTRATE) 10 MG/0.5ML PO SOLN
5.0000 mg | ORAL | Status: DC | PRN
  Administered 2017-08-05: 5 mg via ORAL
  Filled 2017-08-03: qty 1

## 2017-08-03 MED ORDER — BIOTENE DRY MOUTH MT LIQD: 15.0000 mL | OROMUCOSAL | Status: DC | PRN

## 2017-08-03 MED ORDER — BISACODYL 10 MG RE SUPP: 10.0000 mg | Freq: Every day | RECTAL | Status: DC | PRN

## 2017-08-03 MED ORDER — MORPHINE SULFATE (CONCENTRATE) 10 MG/0.5ML PO SOLN
5.0000 mg | ORAL | Status: DC | PRN
  Administered 2017-08-04: 5 mg via SUBLINGUAL
  Filled 2017-08-03: qty 1

## 2017-08-03 MED ORDER — SODIUM POLYSTYRENE SULFONATE 15 GM/60ML PO SUSP
45.0000 g | Freq: Once | ORAL | Status: AC
  Administered 2017-08-03: 45 g via ORAL
  Filled 2017-08-03: qty 180

## 2017-08-03 MED ORDER — POLYETHYLENE GLYCOL 3350 17 G PO PACK: 17.0000 g | PACK | Freq: Every day | ORAL | Status: DC

## 2017-08-03 MED ORDER — LOPERAMIDE HCL 2 MG PO CAPS: 2.0000 mg | ORAL_CAPSULE | ORAL | Status: DC | PRN

## 2017-08-03 MED ORDER — LORAZEPAM 2 MG/ML IJ SOLN
1.0000 mg | INTRAMUSCULAR | Status: DC | PRN
  Administered 2017-08-05: 1 mg via INTRAVENOUS
  Filled 2017-08-03: qty 1

## 2017-08-03 MED ORDER — SODIUM CHLORIDE 0.9% FLUSH
3.0000 mL | Freq: Two times a day (BID) | INTRAVENOUS | Status: DC
  Administered 2017-08-03 – 2017-08-05 (×4): 3 mL via INTRAVENOUS

## 2017-08-03 MED ORDER — SODIUM CHLORIDE 0.9% FLUSH: 3.0000 mL | INTRAVENOUS | Status: DC | PRN

## 2017-08-03 MED ORDER — HALOPERIDOL LACTATE 2 MG/ML PO CONC
0.5000 mg | ORAL | Status: DC | PRN
  Filled 2017-08-03: qty 0.3

## 2017-08-03 MED ORDER — LORAZEPAM 1 MG PO TABS: 1.0000 mg | ORAL_TABLET | ORAL | Status: DC | PRN

## 2017-08-03 MED ORDER — SODIUM CHLORIDE 0.9 % IV SOLN: 250.0000 mL | INTRAVENOUS | Status: DC | PRN

## 2017-08-03 MED ORDER — HALOPERIDOL 0.5 MG PO TABS
0.5000 mg | ORAL_TABLET | ORAL | Status: DC | PRN
  Filled 2017-08-03: qty 1

## 2017-08-03 MED ORDER — CEPHALEXIN 250 MG PO CAPS
250.0000 mg | ORAL_CAPSULE | ORAL | Status: DC
  Administered 2017-08-03: 250 mg via ORAL
  Filled 2017-08-03 (×2): qty 1

## 2017-08-03 MED ORDER — SODIUM CHLORIDE 0.9 % IV SOLN
12.5000 mg | Freq: Four times a day (QID) | INTRAVENOUS | Status: DC | PRN
  Filled 2017-08-03: qty 0.5

## 2017-08-03 MED ORDER — MEGESTROL ACETATE 400 MG/10ML PO SUSP
400.0000 mg | Freq: Two times a day (BID) | ORAL | Status: DC
  Administered 2017-08-03 – 2017-08-05 (×4): 400 mg via ORAL
  Filled 2017-08-03 (×6): qty 10

## 2017-08-03 MED ORDER — HALOPERIDOL LACTATE 5 MG/ML IJ SOLN
0.5000 mg | INTRAMUSCULAR | Status: DC | PRN
  Filled 2017-08-03: qty 0.1

## 2017-08-03 MED ORDER — LORAZEPAM 2 MG/ML IJ SOLN: 1.0000 mg | INTRAMUSCULAR | Status: DC | PRN

## 2017-08-03 MED ORDER — TRAZODONE HCL 50 MG PO TABS: 25.0000 mg | ORAL_TABLET | Freq: Every evening | ORAL | Status: DC | PRN

## 2017-08-03 NOTE — Evaluation (Signed)
Physical Therapy Evaluation Patient Details Name: Elizabeth [name] MRN: [id number] DOB: 05/21/1929 Today's Date: 08/03/2017   History of Present Illness  84 year old female admitted 2-3 weeks ago with hyponatremia following an episode of syncope and collapse, now admitted with renal failure.  PMH includes PAD, HLD, Htn and DM.   Clinical Impression  Pt was generally lethargic and minimally conversive during PT exam.  Husband reports that she is not at her normal (brain imaging show no new infarcts).  She struggled with the 10 minutes of mobility, sitting balance, theraputic activities apart from the exam most notedly with leaning to the R consistently without direct assist from PT.  PT not at all near baseline, will need STR.     Follow Up Recommendations SNF    Equipment Recommendations  (TBD at rehab)    Recommendations for Other Services       Precautions / Restrictions Precautions Precautions: Fall Restrictions Weight Bearing Restrictions: No      Mobility  Bed Mobility Overal bed mobility: Needs Assistance Bed Mobility: Supine to Sit;Sit to Supine     Supine to sit: Mod assist Sit to supine: Mod assist   General bed mobility comments: Struggled with all aspects of getting to EOB, once there she consistently leaned to the R and was unable to keep her balance  Transfers Overall transfer level: Needs assistance Equipment used: Rolling walker (2 wheeled) Transfers: Sit to/from Stand Sit to Stand: Max assist         General transfer comment: Pt struggled with getting to standing needing max assist and heavy cuing/encouragement just to try. She leaned heavily to the R and was unable/unwilling to try any stepping.   Ambulation/Gait             General Gait Details: Pt did ~75 ft with RW with PT during last admit 2-3 weeks ago  Information systems manager [employer] (Stroke Patients Only)       Balance Overall balance assessment:  Needs assistance Sitting-balance support: Single extremity supported;Feet supported Sitting balance-Leahy Scale: Poor Sitting balance - Comments: Pt struggled to maintain mid line and basically leaning to the R anytime PT did not provide direct assist      Standing balance-Leahy Scale: Zero Standing balance comment: struggled to maintain standing even with heavy assist and walker use.                             Pertinent Vitals/Pain Pain Assessment: (does not c/o pain t/o exam)    Home Living Family/patient expects to be discharged to:: Skilled nursing facility Living Arrangements: Spouse/significant other Available Help at Discharge: Family;Available 24 hours/day Type of Home: House Home Access: Stairs to enter Entrance Stairs-Rails: Can reach both Entrance Stairs-Number of Steps: 2 Home Layout: Two level;Able to live on main level with bedroom/bathroom Home Equipment: Dan Humphreys - 2 wheels;Cane - single point      Prior Function Level of Independence: Independent with assistive device(s)         Comments: Pt uses SPC as needed, could walk ad lib, shower, etc     Hand Dominance        Extremity/Trunk Assessment   Upper Extremity Assessment Upper Extremity Assessment: Generalized weakness    Lower Extremity Assessment Lower Extremity Assessment: Generalized weakness       Communication   Communication: No difficulties  Cognition  Arousal/Alertness: Lethargic Behavior During Therapy: Anxious Overall Cognitive Status: Impaired/Different from baseline                                 General Comments: Pt minimally verbal, husband reports that she is not close to her baseline      General Comments      Exercises     Assessment/Plan    PT Assessment Patient needs continued PT services  PT Problem List Decreased strength;Decreased mobility;Decreased knowledge of use of DME       PT Treatment Interventions DME  instruction;Therapeutic activities;Gait training;Therapeutic exercise;Stair training;Balance training;Functional mobility training;Neuromuscular re-education;Patient/family education    PT Goals (Current goals can be found in the Care Plan section)  Acute Rehab PT Goals Patient Stated Goal: husband eager for her to return to her baseline PT Goal Formulation: With patient Time For Goal Achievement: 08/17/17 Potential to Achieve Goals: Fair    Frequency Min 2X/week   Barriers to discharge        Co-evaluation               AM-PAC PT "6 Clicks" Daily Activity  Outcome Measure Difficulty turning over in bed (including adjusting bedclothes, sheets and blankets)?: Unable Difficulty moving from lying on back to sitting on the side of the bed? : Unable Difficulty sitting down on and standing up from a chair with arms (e.g., wheelchair, bedside commode, etc,.)?: Unable Help needed moving to and from a bed to chair (including a wheelchair)?: Total Help needed walking in hospital room?: Total Help needed climbing 3-5 steps with a railing? : Total 6 Click Score: 6    End of Session Equipment Utilized During Treatment: Gait belt Activity Tolerance: Patient limited by fatigue Patient left: with bed alarm set;with family/visitor present;with call bell/phone within reach   PT Visit Diagnosis: Muscle weakness (generalized) (M62.81)    Time: 4818-5631 PT Time Calculation (min) (ACUTE ONLY): 30 min   Charges:   PT Evaluation $PT Eval Low Complexity: 1 Low PT Treatments $Therapeutic Activity: 8-22 mins   PT G Codes:        Malachi Pro, DPT 08/03/2017, 1:25 PM

## 2017-08-03 NOTE — Progress Notes (Signed)
Pharmacist - Prescriber Communication  Keflex dose modified from 500 mg po BID to 250 mg po Q24H due to CrCl 5 to 14 ml/min and not yet on HD.  Demetra Moya A. [place], [place].D., BCPS Clinical Pharmacist 08/03/17 10:25

## 2017-08-03 NOTE — Progress Notes (Signed)
[facility] [place] [facility]   PATIENT NAME: Elizabeth [name]    MR#:  [id number]  DATE OF BIRTH:  1930-01-04  SUBJECTIVE:  CHIEF COMPLAINT:  No chief complaint on file. Patient without complaint, husband is at the bedside, husband is interested in hospice care services-currently unavailable given weekend, husband unable to take care of patient at home, per husband-does not want his wife on dialysis  REVIEW OF SYSTEMS:  CONSTITUTIONAL: No fever, fatigue or weakness.  EYES: No blurred or double vision.  EARS, NOSE, AND THROAT: No tinnitus or ear pain.  RESPIRATORY: No cough, shortness of breath, wheezing or hemoptysis.  CARDIOVASCULAR: No chest pain, orthopnea, edema.  GASTROINTESTINAL: No nausea, vomiting, diarrhea or abdominal pain.  GENITOURINARY: No dysuria, hematuria.  ENDOCRINE: No polyuria, nocturia,  HEMATOLOGY: No anemia, easy bruising or bleeding SKIN: No rash or lesion. MUSCULOSKELETAL: No joint pain or arthritis.   NEUROLOGIC: No tingling, numbness, weakness.  PSYCHIATRY: No anxiety or depression.   ROS  DRUG ALLERGIES:   Allergies  Allergen Reactions  . Meperidine Nausea Only    VITALS:  Blood pressure 132/62, pulse 72, temperature (!) 97.5 F (36.4 C), temperature source Oral, resp. rate 20, height 5\' 4"  (1.626 m), weight 71 kg (156 lb 8.4 oz), SpO2 99 %.  PHYSICAL EXAMINATION:  GENERAL:  84 y.o.-year-old patient lying in the bed with no acute distress.  EYES: Pupils equal, round, reactive to light and accommodation. No scleral icterus. Extraocular muscles intact.  HEENT: Head atraumatic, normocephalic. Oropharynx and nasopharynx clear.  NECK:  Supple, no jugular venous distention. No thyroid enlargement, no tenderness.  LUNGS: Normal breath sounds bilaterally, no wheezing, rales,rhonchi or crepitation. No use of accessory muscles of respiration.  CARDIOVASCULAR: S1, S2 normal. No murmurs, rubs, or gallops.  ABDOMEN: Soft, nontender,  nondistended. Bowel sounds present. No organomegaly or mass.  EXTREMITIES: No pedal edema, cyanosis, or clubbing.  NEUROLOGIC: Cranial nerves II through XII are intact. Muscle strength 5/5 in all extremities. Sensation intact. Gait not checked.  PSYCHIATRIC: The patient is alert and oriented x 3.  SKIN: No obvious rash, lesion, or ulcer.   Physical Exam LABORATORY PANEL:   CBC Recent Labs  Lab 08/03/17 0737  WBC 13.6*  HGB 11.2*  HCT 35.5  PLT 54*   ------------------------------------------------------------------------------------------------------------------  Chemistries  Recent Labs  Lab 08/03/17 0737  NA 139  K 5.8*  CL 100*  CO2 19*  GLUCOSE 199*  BUN 141*  CREATININE 3.79*  CALCIUM 9.4  AST 500*  ALT 1,160*  ALKPHOS 153*  BILITOT 4.9*   ------------------------------------------------------------------------------------------------------------------  Cardiac Enzymes Recent Labs  Lab 07/31/17 0052 07/31/17 0645  TROPONINI 0.30* 0.33*   ------------------------------------------------------------------------------------------------------------------  RADIOLOGY:  US Abdominal Pelvic Art/vent Flow Doppler  Result Date: 08/03/2017 CLINICAL DATA:  Abnormal LFTs. EXAM: DUPLEX ULTRASOUND OF LIVER TECHNIQUE: Color and duplex Doppler ultrasound was performed to evaluate the hepatic in-flow and out-flow vessels. Technologist describes technically difficult study secondary to continued patient movement and breathing. COMPARISON:  07/31/2017 FINDINGS: Portal Vein 1 cm diameter. No evidence of occlusion or thrombus. Velocities (all hepatopetal): Main:  16-18 cm/sec Right:  9 cm/sec Left:  12 cm/sec Hepatic Vein Velocities (all hepatofugal): Right:  11 cm/sec Middle:  16 cm/sec Left:  9 cm/sec Intrahepatic IVC: Patent, velocity 9 cm/sec Hepatic Artery Velocity:  44 cm/sec Spleen 4 x 2 x 5 cm (volume = 20 cm^3). Splenic Vein: No evidence of occlusion or thrombus, velocity:  7 cm/sec Varices: None visualized Ascites: None IMPRESSION: 1.  Unremarkable hepatic vascular Doppler evaluation. Electronically Signed   By: Corlis Leak M.D.   On: 08/03/2017 08:28    ASSESSMENT AND PLAN:  84 year old female with past medical history of diabetes, hypertension, peripheral artery disease, recent admission for hyponatremia, chronic kidney disease stage III who presents to the hospital from direct admission due to altered mental status/encephalopathy and acute on chronic renal failure.  *Acute toxic metabolic encephalopathy Compounded by dementia Most likely secondary to acute hepatorenal failure After long discussion with the patient's husband-patient was made comfort care going forward with plans for placement in a facility with hospice services  *AKI w/ CKD IV Continue worsening noted Nephrology input appreciated Baseline creatinine 2 Plan of care per above, not interested in dialysis  *Acute hyperkalemia Plan of care per above  *Acute liver failure Plan of care per above Suspect due to fatty liver and medication side effect-Zetia/Niacin/Colchicine Liver ultrasound noted for gallstones without pathology/complication  Gastroenterology input appreciated  *Acute thrombocytopenia Plan of care per above Oncologyinput appreciated  *Chronic diabetes mellitus type 2 Plan of care per above  *Chronic dementia Plan of care per above  *Chronic hyperlipidemia, unspecified  Plan of care per above  *Chronic benign essential hypertension  Plan of care per above  Disposition  to facility with hospice care  Palliative care consulted-currently unavailable will given weekend    All the records are reviewed and case discussed with Care Management/Social Workerr. Management plans discussed with the patient, family and they are in agreement.  CODE STATUS: dnr  TOTAL TIME TAKING CARE OF THIS PATIENT: 35 minutes.     POSSIBLE D/C IN 1-3 DAYS, DEPENDING ON  CLINICAL CONDITION.   Evelena Asa Ewa Hipp M.D on 08/03/2017   Between 7am to 6pm - Pager - [phone]  After 6pm go to [email or web address] - password [email or web address]  [facility]  Office  [phone]  CC: Primary care physician; [name], MD  Note: This dictation was prepared with Dragon dictation along with smaller phrase technology. Any transcriptional errors that result from this process are unintentional.

## 2017-08-03 NOTE — Progress Notes (Signed)
[facility] [place], [place] 08/03/17  Subjective:   Patient remains critically ill.   Lab results from this morning show worsening of creatinine to 3.79, BUN 116->141 and potassium is high at 5.8 Bicarbonate remains low.  LFTs continue to remain elevated.  Patient is coagulopathic with INR of 2.81    Objective:  Vital signs in last 24 hours:  Temp:  [97.5 F (36.4 C)-97.6 F (36.4 C)] 97.5 F (36.4 C) (06/16 0535) Pulse Rate:  [67-72] 72 (06/16 0535) Resp:  [20] 20 (06/16 0535) BP: (132)/(62-78) 132/62 (06/16 0535) SpO2:  [97 %-99 %] 99 % (06/16 0535)  Weight change:  Filed Weights   07/30/17 1530  Weight: 71 kg (156 lb 8.4 oz)    Intake/Output:    Intake/Output Summary (Last 24 hours) at 08/03/2017 1200 Last data filed at 08/02/2017 1600 Gross per 24 hour  Intake 100 ml  Output 50 ml  Net 50 ml     Physical Exam: General:  Ill-appearing, laying in the bed  HEENT  moist oral mucous membranes  Neck  supple  Pulm/lungs  normal breathing effort, mild basilar crackles  CVS/Heart  irregular, no rub  Abdomen:   Soft, nontender  Extremities:  No significant edema  Neurologic: Lethargic but  able to follow few simple commands,   Skin:  No acute rashes          Basic Metabolic Panel:  Recent Labs  Lab 07/30/17 1613  07/31/17 0645 08/01/17 0544 08/02/17 0608 08/03/17 0737  NA  --   --  130* 135 137 139  K  --   --  5.5* 5.3* 5.7* 5.8*  CL  --   --  93* 100* 100* 100*  CO2  --   --  22 18* 18* 19*  GLUCOSE  --   --  274* 162* 152* 199*  BUN  --   --  99* 106* 116* 141*  CREATININE 3.95*  --  3.58* 3.40* 3.54* 3.79*  CALCIUM  --    < > 9.3 9.3 9.6 9.4   < > = values in this interval not displayed.     CBC: Recent Labs  Lab 07/30/17 1613 07/31/17 0645 08/01/17 0544 08/02/17 0608 08/03/17 0737  WBC 9.8 10.0 10.3 12.3* 13.6*  NEUTROABS  --   --  9.6*  --   --   HGB 12.5 10.9* 11.0* 11.7* 11.2*  HCT 37.9 33.4* 34.3* 37.2 35.5   MCV 77.8* 76.6* 76.8* 78.7* 78.3*  PLT 85* 72* 73* 63* 54*     No results found for: HEPBSAG, HEPBSAB, HEPBIGM    Microbiology:  Recent Results (from the past 240 hour(s))  Urine Culture     Status: Abnormal   Collection Time: 08/01/17  5:53 AM  Result Value Ref Range Status   Specimen Description   Final    URINE, RANDOM Performed at [facility], [address], [place], [place] [address]    Special Requests   Final    NONE Performed at [facility], [address] [place], [place] [address]    Culture 40,000 COLONIES/mL ESCHERICHIA COLI (A)  Final   Report Status 08/03/2017 FINAL  Final   Organism ID, Bacteria ESCHERICHIA COLI (A)  Final      Susceptibility   Escherichia coli - MIC*    AMPICILLIN 8 SENSITIVE Sensitive     CEFAZOLIN <=4 SENSITIVE Sensitive     CEFTRIAXONE <=1 SENSITIVE Sensitive     CIPROFLOXACIN <=0.25 SENSITIVE  Sensitive     GENTAMICIN <=1 SENSITIVE Sensitive     IMIPENEM <=0.25 SENSITIVE Sensitive     NITROFURANTOIN <=16 SENSITIVE Sensitive     TRIMETH/SULFA <=20 SENSITIVE Sensitive     AMPICILLIN/SULBACTAM 4 SENSITIVE Sensitive     PIP/TAZO <=4 SENSITIVE Sensitive     Extended ESBL NEGATIVE Sensitive     * 40,000 COLONIES/mL ESCHERICHIA COLI    Coagulation Studies: Recent Labs    07/31/17 1620 08/02/17 1001  LABPROT 28.3* 29.4*  INR 2.68 2.81    Urinalysis: Recent Labs    08/02/17 1519  COLORURINE AMBER*  LABSPEC 1.015  PHURINE 5.0  GLUCOSEU NEGATIVE  HGBUR NEGATIVE  BILIRUBINUR NEGATIVE  KETONESUR NEGATIVE  PROTEINUR 100*  NITRITE NEGATIVE  LEUKOCYTESUR TRACE*      Imaging: US Abdominal Pelvic Art/vent Flow Doppler  Result Date: 08/03/2017 CLINICAL DATA:  Abnormal LFTs. EXAM: DUPLEX ULTRASOUND OF LIVER TECHNIQUE: Color and duplex Doppler ultrasound was performed to evaluate the hepatic in-flow and out-flow vessels. Technologist describes technically difficult study secondary to continued patient  movement and breathing. COMPARISON:  07/31/2017 FINDINGS: Portal Vein 1 cm diameter. No evidence of occlusion or thrombus. Velocities (all hepatopetal): Main:  16-18 cm/sec Right:  9 cm/sec Left:  12 cm/sec Hepatic Vein Velocities (all hepatofugal): Right:  11 cm/sec Middle:  16 cm/sec Left:  9 cm/sec Intrahepatic IVC: Patent, velocity 9 cm/sec Hepatic Artery Velocity:  44 cm/sec Spleen 4 x 2 x 5 cm (volume = 20 cm^3). Splenic Vein: No evidence of occlusion or thrombus, velocity: 7 cm/sec Varices: None visualized Ascites: None IMPRESSION: 1. Unremarkable hepatic vascular Doppler evaluation. Electronically Signed   By: Corlis Leak M.D.   On: 08/03/2017 08:28     Medications:   . sodium chloride    . chlorproMAZINE (THORAZINE) IV     . aspirin EC  81 mg Oral Daily  . cephALEXin  250 mg Oral Q24H  . feeding supplement (ENSURE ENLIVE)  237 mL Oral BID BM  . mouth rinse  15 mL Mouth Rinse BID  . megestrol  400 mg Oral BID  . sodium chloride flush  3 mL Intravenous Q12H   sodium chloride, antiseptic oral rinse, bisacodyl, chlorproMAZINE (THORAZINE) IV, diphenhydrAMINE, haloperidol **OR** haloperidol **OR** haloperidol lactate, loperamide, LORazepam **OR** LORazepam **OR** LORazepam, LORazepam, morphine CONCENTRATE **OR** morphine CONCENTRATE, ondansetron **OR** ondansetron (ZOFRAN) IV, polyvinyl alcohol, sodium chloride flush, traZODone  Assessment/ Plan:  84 y.o. African-American female with  diabetes, hypertension, hyperlipidemia, chronic kidney disease stage IV, peripheral vascular disease, triple-vessel CABG, chronic systolic congestive heart failure with LVEF 20 to 25% from echo in May 2019, history of femoral bypass, who was admitted to [facility] directly from PMD office on 07/30/2017 for evaluation of acute renal failure and elevated liver enzymes.   1.  Acute kidney injury 2.  Chronic kidney disease stage IV, baseline creatinine 2.17/GFR 23 from 07/16/2017 3.  Severe hyperkalemia 4.  Metabolic  acidosis/ Lactic acidosis 5.  Acute hepatitis/transaminitis 6.  Altered mental status 7.  Thrombocytopenia 8.  Chronic systolic congestive heart failure with LVEF 20 to 25% from May 2019  No schistocytes noted on peripheral smear.  Right upper quadrant ultrasound shows cholelithiasis, fatty infiltration of liver. LFTs are worse. Coagulopathic with INR 2.81 Patient's mental status is not at baseline and uremia may be conributing. Renal ultrasound without any evidence of hydronephrosis.  Potassium, BUN level are worse today.  Case discussed with Dr. Katheren Shams.  Family is leaning towards hospice and not wanting aggressive services such as  dialysis given her poor underlying health with chronic systolic congestive heart failure and ongoing liver problems.  We will continue to follow      LOS: 4 Elizabeth [name] 6/16/201912:00 PM  [facility] [place], [place] [phone]  Note: This note was prepared with Dragon dictation. Any transcription errors are unintentional

## 2017-08-03 NOTE — Progress Notes (Signed)
Family Meeting Note  Advance Directive:yes  Today a meeting took place with the Patient, spouse.  Patient is unable to participate due JK:DTOIZT capacity Dementia   The following clinical team members were present during this meeting:MD  The following were discussed:Patient's diagnosis: Acute multiorgan system failure, Patient's progosis: Unable to determine and Goals for treatment: DNR  Additional follow-up to be provided: prn  Time spent during discussion:30 minutes  Bertrum Sol, MD

## 2017-08-03 NOTE — Clinical Social Work Note (Signed)
Clinical Social Work Assessment  Patient Details  Name: Elizabeth [name] MRN: [id number] Date of Birth: [birth year]  Date of referral:  08/03/17               Reason for consult:  Discharge Planning                Permission sought to share information with:    Permission granted to share information::     Name::        Agency::     Relationship::     Contact Information:     Housing/Transportation Living arrangements for the past 2 months:  Single Family Home Source of Information:  Spouse Patient Interpreter Needed:  None Criminal Activity/Legal Involvement Pertinent to Current Situation/Hospitalization:  No - Comment as needed Significant Relationships:  Spouse Lives with:  Spouse Do you feel safe going back to the place where you live?    Need for family participation in patient care:  Yes (Comment)  Care giving concerns:  Patient resides with her husband at home.    Social Worker assessment / plan:  CSW has noted that patient's husband has chosen not to pursue dialysis or aggressive measures at this time. CSW spoke with patient's husband this afternoon about end of life care and hospice versus hospice home. CSW informed patient's husband that the physician would need to determine if patient was appropriate for hospice home. Patient's husband was not aware that hospice could also be provided in the home setting. CSW explained to him what that would look like and that. Patient's husband believes that patient is sometimes able to understand him and he is going to speak with his wife to see which she may prefer. Hospice at home versus hospice home. CSW explained that we would follow up with him in the morning. He verbalized understanding.  Employment status:  Retired Health and safety inspector:  Managed Care PT Recommendations:    Information / Referral to community resources:     Patient/Family's Response to care:  Patient's husband expressed appreciation for CSW  assistance.  Patient/Family's Understanding of and Emotional Response to Diagnosis, Current Treatment, and Prognosis:  Patient's husband wishes to honor his wife's wishes from long ago and not pursue aggressive measures to keep her alive.  Emotional Assessment Appearance:  Appears stated age Attitude/Demeanor/Rapport:  (confused X4) Affect (typically observed):  (confused X4) Orientation:    Alcohol / Substance use:  Not Applicable Psych involvement (Current and /or in the community):  No (Comment)  Discharge Needs  Concerns to be addressed:  Care Coordination Readmission within the last 30 days:  No Current discharge risk:  None Barriers to Discharge:  No Barriers Identified   Elizabeth Spaniel, LCSW 08/03/2017, 2:48 PM

## 2017-08-04 DIAGNOSIS — N170 Acute kidney failure with tubular necrosis: Secondary | ICD-10-CM

## 2017-08-04 DIAGNOSIS — R945 Abnormal results of liver function studies: Secondary | ICD-10-CM

## 2017-08-04 DIAGNOSIS — K7200 Acute and subacute hepatic failure without coma: Secondary | ICD-10-CM

## 2017-08-04 DIAGNOSIS — R7989 Other specified abnormal findings of blood chemistry: Secondary | ICD-10-CM

## 2017-08-04 DIAGNOSIS — Z515 Encounter for palliative care: Secondary | ICD-10-CM

## 2017-08-04 DIAGNOSIS — R1310 Dysphagia, unspecified: Secondary | ICD-10-CM

## 2017-08-04 LAB — HEPATITIS B DNA, ULTRAQUANTITATIVE, PCR
HBV DNA SERPL PCR-ACNC: NOT DETECTED [IU]/mL
HBV DNA SERPL PCR-LOG IU: UNDETERMINED log10 IU/mL

## 2017-08-04 LAB — HEPATITIS B CORE ANTIBODY, TOTAL: HEP B C TOTAL AB: NEGATIVE

## 2017-08-04 LAB — HEPATITIS B E ANTIGEN: HEP B E AG: NEGATIVE

## 2017-08-04 LAB — EBV AB TO VIRAL CAPSID AG PNL, IGG+IGM
EBV VCA IGG: 21 U/mL — AB (ref 0.0–17.9)
EBV VCA IgM: <36 U/mL (ref 0.0–35.9)

## 2017-08-04 LAB — HEPATITIS PANEL, ACUTE
HEP B C IGM: NEGATIVE
Hep A IgM: NEGATIVE
Hepatitis B Surface Ag: NEGATIVE

## 2017-08-04 MED ORDER — GLYCOPYRROLATE 1 MG PO TABS
1.0000 mg | ORAL_TABLET | ORAL | Status: DC | PRN
  Filled 2017-08-04: qty 1

## 2017-08-04 MED ORDER — SODIUM CHLORIDE 0.9% FLUSH: 3.0000 mL | INTRAVENOUS | Status: DC | PRN

## 2017-08-04 MED ORDER — GLYCOPYRROLATE 0.2 MG/ML IJ SOLN
0.2000 mg | INTRAMUSCULAR | Status: DC | PRN
  Filled 2017-08-04: qty 1

## 2017-08-04 MED ORDER — LORAZEPAM 0.5 MG PO TABS: 0.2500 mg | ORAL_TABLET | Freq: Two times a day (BID) | ORAL | Status: DC

## 2017-08-04 MED ORDER — HALOPERIDOL LACTATE 2 MG/ML PO CONC
2.0000 mg | Freq: Two times a day (BID) | ORAL | Status: DC
  Administered 2017-08-04 – 2017-08-05 (×3): 2 mg via ORAL
  Filled 2017-08-04 (×4): qty 1

## 2017-08-04 MED ORDER — ACETAMINOPHEN 325 MG PO TABS: 650.0000 mg | ORAL_TABLET | Freq: Four times a day (QID) | ORAL | Status: DC | PRN

## 2017-08-04 MED ORDER — ACETAMINOPHEN 650 MG RE SUPP: 650.0000 mg | Freq: Four times a day (QID) | RECTAL | Status: DC | PRN

## 2017-08-04 MED ORDER — SODIUM CHLORIDE 0.9 % IV SOLN: 250.0000 mL | INTRAVENOUS | Status: DC | PRN

## 2017-08-04 MED ORDER — SODIUM CHLORIDE 0.9% FLUSH
3.0000 mL | Freq: Two times a day (BID) | INTRAVENOUS | Status: DC
  Administered 2017-08-04 – 2017-08-05 (×3): 3 mL via INTRAVENOUS

## 2017-08-04 NOTE — Consult Note (Addendum)
Consultation Note Date: 08/04/2017   Patient Name: Elizabeth [name]  DOB: 04-09-1929  MRN: [id number]  Age / Sex: 84 y.o., female  PCP: Baxter Hire, MD Referring Physician: Gorden Harms, MD  Reason for Consultation: Establishing goals of care, Hospice Evaluation and Psychosocial/spiritual support  HPI/Patient Profile: 84 y.o. female  with past medical history of CAD sp CABG, DM, PAD, CHF EF 20-25% who was admitted on 07/30/2017 with lethargy,  renal failure, and acute liver failure.   After 3 days in the hospital she is eating only minimal bites, is totally dependent and bed bound.  Her creatinine is trending up.  This is her second hospitalization in just a few weeks.   Clinical Assessment and Goals of Care:  I have reviewed medical records including EPIC notes, labs and imaging, received report from the bedside RN and CSW, assessed the patient and then met at the bedside along with her husband Elizabeth [name] to discuss diagnosis prognosis, [place], EOL wishes, disposition and options.     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  Her students loved her.  She was also a Optometrist for [employer].  Her husband taught high school biology.  The two of them have no children.  She was 1 of 13 siblings and just lost her last sibling 3 weeks ago (her husband believes that triggered her rapid decline).  Per her husband she had respiratory arrest 3 weeks ago.  He called the ambulance and did CPR.  He states that his wife is comfortable with the decision to go to hospice.  "She says she is ready"  After observing the patient during a bath and talking with the RN and Tech it is apparent that the patient is nearing EOL.  She speaks minimally, is unable to lift her head or extremities, she is eating only bites (with a lot of encouragement).  I spoke with her husband briefly.  He states she would not want more aggressive  care.  He wants her to be comfortable at this point.    Primary Decision Maker:  NEXT OF KIN husband    SUMMARY OF RECOMMENDATIONS    DC to hospice house when bed is available.  Will shift orders to total comfort.   Code Status/Advance Care Planning:  DNR   Symptom Management:  haldol low dose scheduled for anxiety and agitation   Additional Recommendations (Limitations, Scope, Preferences):  Full Comfort Care  Palliative Prophylaxis:   Frequent Pain Assessment  Psycho-social/Spiritual:   Desire for further Chaplaincy support:  Prognosis:   Two weeks or less given that she has no appetite and is eating only minimal bites when encouraged.  Rapid decline, progressively worsening renal failure, liver failure, dementia, CHF with an LVEF 20 - 25%   Discharge Planning: Hospice facility      Primary Diagnoses: Present on Admission: . Acute renal failure (ARF) ([place])   I have reviewed the medical record, interviewed the patient and family, and examined the patient. The following aspects are  pertinent.  Past Medical History:  Diagnosis Date  . Diabetes mellitus without complication ([place])   . Hyperlipidemia   . Hypertension   . Peripheral arterial disease ([place])    Social History   Socioeconomic History  . Marital status: Married    Spouse name: Not on file  . Number of children: Not on file  . Years of education: Not on file  . Highest education level: Not on file  Occupational History  . Not on file  Social Needs  . Financial resource strain: Not on file  . Food insecurity:    Worry: Not on file    Inability: Not on file  . Transportation needs:    Medical: Not on file    Non-medical: Not on file  Tobacco Use  . Smoking status: Former Smoker    Types: Cigarettes  . Smokeless tobacco: Never Used  Substance and Sexual Activity  . Alcohol use: No  . Drug use: No  . Sexual activity: Not on file  Lifestyle  . Physical activity:    Days per week: Not  on file    Minutes per session: Not on file  . Stress: Not on file  Relationships  . Social connections:    Talks on phone: Not on file    Gets together: Not on file    Attends religious service: Not on file    Active member of club or organization: Not on file    Attends meetings of clubs or organizations: Not on file    Relationship status: Not on file  Other Topics Concern  . Not on file  Social History Narrative  . Not on file   Family History  Problem Relation Age of Onset  . Diabetes Mother    Scheduled Meds: . aspirin EC  81 mg Oral Daily  . cephALEXin  250 mg Oral Q24H  . feeding supplement (ENSURE ENLIVE)  237 mL Oral BID BM  . mouth rinse  15 mL Mouth Rinse BID  . megestrol  400 mg Oral BID  . sodium chloride flush  3 mL Intravenous Q12H   Continuous Infusions: . sodium chloride    . chlorproMAZINE (THORAZINE) IV     PRN Meds:.sodium chloride, antiseptic oral rinse, bisacodyl, chlorproMAZINE (THORAZINE) IV, diphenhydrAMINE, haloperidol **OR** haloperidol **OR** haloperidol lactate, loperamide, LORazepam **OR** LORazepam **OR** LORazepam, LORazepam, morphine CONCENTRATE **OR** morphine CONCENTRATE, ondansetron **OR** ondansetron (ZOFRAN) IV, polyvinyl alcohol, sodium chloride flush, traZODone Allergies  Allergen Reactions  . Meperidine Nausea Only   Review of Systems patient is pleasantly demented.  Physical Exam  Well developed elderly female, demented, NAD CV rrr with systolic murmur Abdomen soft, nt, nd Sacral small ulceration.  Vital Signs: BP 140/83 (BP Location: Left Arm)   Pulse 83   Temp 98.2 F (36.8 C) (Oral)   Resp 20   Ht _0  (1.626 m)   Wt 71 kg (156 lb 8.4 oz)   SpO2 91%   BMI 26.87 kg/m  Pain Scale: Faces POSS *See Group Information*: 1-Acceptable,Awake and alert Pain Score: 0-No pain   SpO2: SpO2: 91 % O2 Device:SpO2: 91 % O2 Flow Rate: .O2 Flow Rate (L/min): 2 L/min  IO: Intake/output summary:   Intake/Output Summary (Last  24 hours) at 08/04/2017 1012 Last data filed at 08/04/2017 0535 Gross per 24 hour  Intake 0 ml  Output 975 ml  Net -975 ml    LBM: Last BM Date: 07/30/17 Baseline Weight: Weight: 71 kg (156 lb 8.4 oz) Most recent weight:  Weight: 71 kg (156 lb 8.4 oz)     Palliative Assessment/Data: 20%     Time In: 10:00 Time Out: 10:45 Time Total: 50 min. Greater than 50%  of this time was spent counseling and coordinating care related to the above assessment and plan.  Signed by: Florentina Jenny, PA-C Palliative Medicine Pager: [phone]  Please contact Palliative Medicine Team phone at [phone] for questions and concerns.  For individual provider: See Shea Evans

## 2017-08-04 NOTE — Progress Notes (Signed)
[facility] - [place] at [facility]   PATIENT NAME: Elizabeth [name]    MR#:  [id number]  DATE OF BIRTH:  Feb 21, 1929  SUBJECTIVE:  CHIEF COMPLAINT:  No chief complaint on file. Patient is confused at baseline, husband at the bedside, remains interested in hospice, currently awaiting hospice bed placement  REVIEW OF SYSTEMS:  CONSTITUTIONAL: No fever, fatigue or weakness.  EYES: No blurred or double vision.  EARS, NOSE, AND THROAT: No tinnitus or ear pain.  RESPIRATORY: No cough, shortness of breath, wheezing or hemoptysis.  CARDIOVASCULAR: No chest pain, orthopnea, edema.  GASTROINTESTINAL: No nausea, vomiting, diarrhea or abdominal pain.  GENITOURINARY: No dysuria, hematuria.  ENDOCRINE: No polyuria, nocturia,  HEMATOLOGY: No anemia, easy bruising or bleeding SKIN: No rash or lesion. MUSCULOSKELETAL: No joint pain or arthritis.   NEUROLOGIC: No tingling, numbness, weakness.  PSYCHIATRY: No anxiety or depression.   ROS  DRUG ALLERGIES:   Allergies  Allergen Reactions  . Meperidine Nausea Only    VITALS:  Blood pressure 140/83, pulse 83, temperature 98.2 F (36.8 C), temperature source Oral, resp. rate 20, height 5\' 4"  (1.626 m), weight 71 kg (156 lb 8.4 oz), SpO2 91 %.  PHYSICAL EXAMINATION:  GENERAL:  84 y.o.-year-old patient lying in the bed with no acute distress.  EYES: Pupils equal, round, reactive to light and accommodation. No scleral icterus. Extraocular muscles intact.  HEENT: Head atraumatic, normocephalic. Oropharynx and nasopharynx clear.  NECK:  Supple, no jugular venous distention. No thyroid enlargement, no tenderness.  LUNGS: Normal breath sounds bilaterally, no wheezing, rales,rhonchi or crepitation. No use of accessory muscles of respiration.  CARDIOVASCULAR: S1, S2 normal. No murmurs, rubs, or gallops.  ABDOMEN: Soft, nontender, nondistended. Bowel sounds present. No organomegaly or mass.  EXTREMITIES: No pedal edema, cyanosis, or  clubbing.  NEUROLOGIC: Cranial nerves II through XII are intact. Muscle strength 5/5 in all extremities. Sensation intact. Gait not checked.  PSYCHIATRIC: The patient is alert and oriented x 3.  SKIN: No obvious rash, lesion, or ulcer.   Physical Exam LABORATORY PANEL:   CBC Recent Labs  Lab 08/03/17 0737  WBC 13.6*  HGB 11.2*  HCT 35.5  PLT 54*   ------------------------------------------------------------------------------------------------------------------  Chemistries  Recent Labs  Lab 08/03/17 0737  NA 139  K 5.8*  CL 100*  CO2 19*  GLUCOSE 199*  BUN 141*  CREATININE 3.79*  CALCIUM 9.4  AST 500*  ALT 1,160*  ALKPHOS 153*  BILITOT 4.9*   ------------------------------------------------------------------------------------------------------------------  Cardiac Enzymes Recent Labs  Lab 07/31/17 0052 07/31/17 0645  TROPONINI 0.30* 0.33*   ------------------------------------------------------------------------------------------------------------------  RADIOLOGY:  US Abdominal Pelvic Art/vent Flow Doppler  Result Date: 08/03/2017 CLINICAL DATA:  Abnormal LFTs. EXAM: DUPLEX ULTRASOUND OF LIVER TECHNIQUE: Color and duplex Doppler ultrasound was performed to evaluate the hepatic in-flow and out-flow vessels. Technologist describes technically difficult study secondary to continued patient movement and breathing. COMPARISON:  07/31/2017 FINDINGS: Portal Vein 1 cm diameter. No evidence of occlusion or thrombus. Velocities (all hepatopetal): Main:  16-18 cm/sec Right:  9 cm/sec Left:  12 cm/sec Hepatic Vein Velocities (all hepatofugal): Right:  11 cm/sec Middle:  16 cm/sec Left:  9 cm/sec Intrahepatic IVC: Patent, velocity 9 cm/sec Hepatic Artery Velocity:  44 cm/sec Spleen 4 x 2 x 5 cm (volume = 20 cm^3). Splenic Vein: No evidence of occlusion or thrombus, velocity: 7 cm/sec Varices: None visualized Ascites: None IMPRESSION: 1. Unremarkable hepatic vascular Doppler  evaluation. Electronically Signed   By: Corlis Leak M.D.   On: 08/03/2017  08:28    ASSESSMENT AND PLAN:  84 year old female with past medical history of diabetes, hypertension, peripheral artery disease, recent admission for hyponatremia, chronic kidney disease stage III who presents to the hospital from direct admission due to altered mental status/encephalopathy and acute on chronic renal failure.  *Acute toxic metabolic encephalopathy Compounded by dementia Most likely secondary to acute hepatorenal failure After long discussion with the patient's husband-patient was made comfort care going forward with plans for placement in a facility with hospice services Palliative/hospice input appreciated-currently awaiting hospice bed opening  *AKI w/ CKD IV Continue worsening noted Nephrology input appreciated Baseline creatinine 2 Plan of care per above, not interested in dialysis  *Acute hyperkalemia Plan of care per above  *Acuteliver failure Plan of care per above Suspect due to fatty liver and medication side effect-Zetia/Niacin/Colchicine Liver ultrasound noted for gallstones without pathology/complication Gastroenterology input appreciated  *Acute thrombocytopenia Plan of care per above Oncologyinput appreciated  *Chronic diabetes mellitus type 2 Plan of care per above  *Chronic dementia Plan of care per above  *Chronic hyperlipidemia, unspecified  Plan of care per above  *Chronic benign essential hypertension  Plan of care per above  *Chronic pressure wound Stage I-II sacral wound Continue local wound care Plan of care stated above  Disposition to hospice house when bed is available    All the records are reviewed and case discussed with Care Management/Social Workerr. Management plans discussed with the patient, family and they are in agreement.  CODE STATUS: DNR  TOTAL TIME TAKING CARE OF THIS PATIENT: 35 minutes.     POSSIBLE D/C IN 1-3  DAYS, DEPENDING ON CLINICAL CONDITION.   Evelena Asa Salary M.D on 08/04/2017   Between 7am to 6pm - Pager - [phone]  After 6pm go to [email or web address] - password [email or web address]  [facility]  Office  [phone]  CC: Primary care physician; [name], MD  Note: This dictation was prepared with Dragon dictation along with smaller phrase technology. Any transcriptional errors that result from this process are unintentional.

## 2017-08-04 NOTE — Progress Notes (Signed)
New hospice home referral received from [facility] following a Palliative Medicine consult. Patient is an 84 year old woman with a past medical history of diabetes II, HLD, CKD stage III, Dementia, HTN, HLD, PVD admitted to [facility] on 6/12 for treatment for altered mental status and acute on chronic renal failure. She has continued to decline despite medical interventions and Palliative Medicine was consulted for goals of care. Palliative Medicine PA met with patient's husband Denyse Amass today, he has chosen to focus on comfort with a transfer to the hospice home.  Patient seen lying in bed, will open her eyes to voice, but quickly closes them. She has been started on scheduled haldol liquid 2 mg solution for agitation. She is not taking her oral medications and eating only bites and sips.  Writer met in the family room with Mr. Caldas and his nephew Lorinda Creed to initiate education regarding hospice services, philosophy and team approach to care with good understanding voiced. Questions answered, consents signed. Patient information faxed to referral. Plan is for transfer to the hospice home tomorrow 6/18. Hospital care team and family aware. Signed DNR in place in patient's chart. Will continue to follow through final disposition. Thank you for the opportunity to be involved in the care of this patient and her family. Flo Shanks RN, BSN, [facility] [place], hospital liaison [phone]

## 2017-08-04 NOTE — Clinical Social Work Note (Signed)
Patient is hospice home appropriate and referral has been made to [place] with [facility] [address] [place] Spaniel MSW,LCSW [phone]

## 2017-08-04 NOTE — Care Management (Signed)
Informed by hospice liaison that it is anticipated that patient will discharge to a hospice facility 08/05/2017.

## 2017-08-04 NOTE — Progress Notes (Signed)
PT Cancellation Note  Patient Details Name: Elizabeth [name] MRN: [id number] DOB: [birth year]   Cancelled Treatment:    Reason Eval/Treat Not Completed: Other (comment). Per chart review and acknowledging new order for cancellation of PT, pt is nearing end of life and desires only comfort care. Complete current PT order.    Scot Dock, PTA 08/04/2017, 12:22 PM

## 2017-08-04 NOTE — Care Management Important Message (Signed)
Copy of signed IM left with patient in room.  

## 2017-08-04 NOTE — Progress Notes (Signed)
[facility] [place], [place] 08/04/17  Subjective:  No new renal function testing this AM.   Family has decided upon conservative care. This appears appropriate.    Objective:  Vital signs in last 24 hours:  Temp:  [98.1 F (36.7 C)-98.2 F (36.8 C)] 98.2 F (36.8 C) (06/17 0535) Pulse Rate:  [83-84] 83 (06/17 0535) Resp:  [20] 20 (06/17 0535) BP: (117-140)/(66-83) 140/83 (06/17 0535) SpO2:  [89 %-100 %] 91 % (06/17 0647)  Weight change:  Filed Weights   07/30/17 1530  Weight: 71 kg (156 lb 8.4 oz)    Intake/Output:    Intake/Output Summary (Last 24 hours) at 08/04/2017 1301 Last data filed at 08/04/2017 0535 Gross per 24 hour  Intake 0 ml  Output 975 ml  Net -975 ml     Physical Exam: General:  Ill-appearing, laying in the bed  HEENT  moist oral mucous membranes  Neck  supple  Pulm/lungs  normal breathing effort, mild basilar crackles  CVS/Heart  irregular, no rub  Abdomen:   Soft, nontender  Extremities:  No significant edema  Neurologic:  Lethargic but arousable  Skin:  No acute rashes          Basic Metabolic Panel:  Recent Labs  Lab 07/30/17 1613  07/31/17 0645 08/01/17 0544 08/02/17 0608 08/03/17 0737  NA  --   --  130* 135 137 139  K  --   --  5.5* 5.3* 5.7* 5.8*  CL  --   --  93* 100* 100* 100*  CO2  --   --  22 18* 18* 19*  GLUCOSE  --   --  274* 162* 152* 199*  BUN  --   --  99* 106* 116* 141*  CREATININE 3.95*  --  3.58* 3.40* 3.54* 3.79*  CALCIUM  --    < > 9.3 9.3 9.6 9.4   < > = values in this interval not displayed.     CBC: Recent Labs  Lab 07/30/17 1613 07/31/17 0645 08/01/17 0544 08/02/17 0608 08/03/17 0737  WBC 9.8 10.0 10.3 12.3* 13.6*  NEUTROABS  --   --  9.6*  --   --   HGB 12.5 10.9* 11.0* 11.7* 11.2*  HCT 37.9 33.4* 34.3* 37.2 35.5  MCV 77.8* 76.6* 76.8* 78.7* 78.3*  PLT 85* 72* 73* 63* 54*     No results found for: HEPBSAG, HEPBSAB, HEPBIGM    Microbiology:  Recent Results (from  the past 240 hour(s))  Urine Culture     Status: Abnormal   Collection Time: 08/01/17  5:53 AM  Result Value Ref Range Status   Specimen Description   Final    URINE, RANDOM Performed at [facility], [address] [place], [place] [address]    Special Requests   Final    NONE Performed at [facility], [address] [place], [place] [address]    Culture 40,000 COLONIES/mL ESCHERICHIA COLI (A)  Final   Report Status 08/03/2017 FINAL  Final   Organism ID, Bacteria ESCHERICHIA COLI (A)  Final      Susceptibility   Escherichia coli - MIC*    AMPICILLIN 8 SENSITIVE Sensitive     CEFAZOLIN <=4 SENSITIVE Sensitive     CEFTRIAXONE <=1 SENSITIVE Sensitive     CIPROFLOXACIN <=0.25 SENSITIVE Sensitive     GENTAMICIN <=1 SENSITIVE Sensitive     IMIPENEM <=0.25 SENSITIVE Sensitive     NITROFURANTOIN <=16 SENSITIVE Sensitive     TRIMETH/SULFA <=20  SENSITIVE Sensitive     AMPICILLIN/SULBACTAM 4 SENSITIVE Sensitive     PIP/TAZO <=4 SENSITIVE Sensitive     Extended ESBL NEGATIVE Sensitive     * 40,000 COLONIES/mL ESCHERICHIA COLI    Coagulation Studies: Recent Labs    08/02/17 1001  LABPROT 29.4*  INR 2.81    Urinalysis: Recent Labs    08/02/17 1519  COLORURINE AMBER*  LABSPEC 1.015  PHURINE 5.0  GLUCOSEU NEGATIVE  HGBUR NEGATIVE  BILIRUBINUR NEGATIVE  KETONESUR NEGATIVE  PROTEINUR 100*  NITRITE NEGATIVE  LEUKOCYTESUR TRACE*      Imaging: US Abdominal Pelvic Art/vent Flow Doppler  Result Date: 08/03/2017 CLINICAL DATA:  Abnormal LFTs. EXAM: DUPLEX ULTRASOUND OF LIVER TECHNIQUE: Color and duplex Doppler ultrasound was performed to evaluate the hepatic in-flow and out-flow vessels. Technologist describes technically difficult study secondary to continued patient movement and breathing. COMPARISON:  07/31/2017 FINDINGS: Portal Vein 1 cm diameter. No evidence of occlusion or thrombus. Velocities (all hepatopetal): Main:  16-18 cm/sec Right:  9 cm/sec  Left:  12 cm/sec Hepatic Vein Velocities (all hepatofugal): Right:  11 cm/sec Middle:  16 cm/sec Left:  9 cm/sec Intrahepatic IVC: Patent, velocity 9 cm/sec Hepatic Artery Velocity:  44 cm/sec Spleen 4 x 2 x 5 cm (volume = 20 cm^3). Splenic Vein: No evidence of occlusion or thrombus, velocity: 7 cm/sec Varices: None visualized Ascites: None IMPRESSION: 1. Unremarkable hepatic vascular Doppler evaluation. Electronically Signed   By: Corlis Leak M.D.   On: 08/03/2017 08:28     Medications:   . sodium chloride    . sodium chloride    . chlorproMAZINE (THORAZINE) IV     . feeding supplement (ENSURE ENLIVE)  237 mL Oral BID BM  . haloperidol  2 mg Oral BID  . mouth rinse  15 mL Mouth Rinse BID  . megestrol  400 mg Oral BID  . sodium chloride flush  3 mL Intravenous Q12H  . sodium chloride flush  3 mL Intravenous Q12H   sodium chloride, sodium chloride, acetaminophen **OR** acetaminophen, antiseptic oral rinse, bisacodyl, chlorproMAZINE (THORAZINE) IV, diphenhydrAMINE, glycopyrrolate **OR** glycopyrrolate **OR** glycopyrrolate, haloperidol **OR** haloperidol **OR** haloperidol lactate, loperamide, LORazepam **OR** LORazepam **OR** LORazepam, LORazepam, morphine CONCENTRATE **OR** morphine CONCENTRATE, ondansetron **OR** ondansetron (ZOFRAN) IV, polyvinyl alcohol, sodium chloride flush, sodium chloride flush, traZODone  Assessment/ Plan:  84 y.o. African-American female with  diabetes, hypertension, hyperlipidemia, chronic kidney disease stage IV, peripheral vascular disease, triple-vessel CABG, chronic systolic congestive heart failure with LVEF 20 to 25% from echo in May 2019, history of femoral bypass, who was admitted to [facility] directly from PMD office on 07/30/2017 for evaluation of acute renal failure and elevated liver enzymes.   1.  Acute kidney injury 2.  Chronic kidney disease stage IV, baseline creatinine 2.17/GFR 23 from 07/16/2017 3.  Severe hyperkalemia 4.  Metabolic acidosis/ Lactic  acidosis 5.  Acute hepatitis/transaminitis 6.  Altered mental status 7.  Thrombocytopenia 8.  Chronic systolic congestive heart failure with LVEF 20 to 25% from May 2019  -Patient seen and evaluated at bedside.  Case was discussed with the patient's husband.  They have opted for conservative management and dialysis will not be pursued.  This certainly appears to be reasonable given the patient's age and multiple comorbidities.  Hospice is being pursued at this time which is appropriate.  Thank you for allowing [place] to participate in the patient's care.   LOS: 5 Edgar Reisz 6/17/20191:01 PM  [facility] [place], [place] [phone]  Note: This note  was prepared with Dragon dictation. Any transcription errors are unintentional

## 2017-08-05 LAB — HEPATITIS B E ANTIBODY: Hep B E Ab: NEGATIVE

## 2017-08-05 LAB — HERPES SIMPLEX VIRUS(HSV) DNA BY PCR
HSV 1 DNA: NEGATIVE
HSV 2 DNA: NEGATIVE

## 2017-08-05 LAB — CMV DNA, QUANTITATIVE, PCR
CMV DNA QUANT: NEGATIVE [IU]/mL
LOG10 CMV QN DNA PL: UNDETERMINED {Log_IU}/mL

## 2017-08-05 MED ORDER — BIOTENE DRY MOUTH MT LIQD: 15.0000 mL | OROMUCOSAL | Status: AC | PRN

## 2017-08-05 MED ORDER — LORAZEPAM 2 MG/ML PO CONC: 1.0000 mg | ORAL | 0 refills | Status: AC | PRN

## 2017-08-05 MED ORDER — ACETAMINOPHEN 325 MG PO TABS: 650.0000 mg | ORAL_TABLET | Freq: Four times a day (QID) | ORAL | Status: AC | PRN

## 2017-08-05 MED ORDER — MORPHINE SULFATE (CONCENTRATE) 10 MG/0.5ML PO SOLN: 5.0000 mg | ORAL | Status: AC | PRN

## 2017-08-05 MED ORDER — SODIUM CHLORIDE 0.9 % IV SOLN: 12.5000 mg | Freq: Four times a day (QID) | INTRAVENOUS | Status: AC | PRN

## 2017-08-05 MED ORDER — ONDANSETRON HCL 4 MG/2ML IJ SOLN: 4.0000 mg | Freq: Four times a day (QID) | INTRAMUSCULAR | 0 refills | Status: AC | PRN

## 2017-08-05 MED ORDER — GLYCOPYRROLATE 0.2 MG/ML IJ SOLN: 0.2000 mg | INTRAMUSCULAR | Status: AC | PRN

## 2017-08-05 MED ORDER — POLYVINYL ALCOHOL 1.4 % OP SOLN: 1.0000 [drp] | Freq: Four times a day (QID) | OPHTHALMIC | 0 refills | Status: AC | PRN

## 2017-08-05 MED ORDER — LORAZEPAM 2 MG/ML IJ SOLN: 1.0000 mg | INTRAMUSCULAR | 0 refills | Status: AC | PRN

## 2017-08-05 MED ORDER — HALOPERIDOL LACTATE 2 MG/ML PO CONC: 2.0000 mg | Freq: Two times a day (BID) | ORAL | 0 refills | Status: AC

## 2017-08-05 MED ORDER — ORAL CARE MOUTH RINSE: 15.0000 mL | Freq: Two times a day (BID) | OROMUCOSAL | 0 refills | Status: AC

## 2017-08-05 MED ORDER — GLYCOPYRROLATE 1 MG PO TABS: 1.0000 mg | ORAL_TABLET | ORAL | Status: AC | PRN

## 2017-08-05 MED ORDER — TRAZODONE HCL 50 MG PO TABS: 25.0000 mg | ORAL_TABLET | Freq: Every evening | ORAL | Status: AC | PRN

## 2017-08-05 MED ORDER — LOPERAMIDE HCL 2 MG PO CAPS: 2.0000 mg | ORAL_CAPSULE | ORAL | 0 refills | Status: AC | PRN

## 2017-08-05 MED ORDER — HALOPERIDOL LACTATE 5 MG/ML IJ SOLN: 0.5000 mg | INTRAMUSCULAR | Status: AC | PRN

## 2017-08-05 MED ORDER — HALOPERIDOL 0.5 MG PO TABS: 0.5000 mg | ORAL_TABLET | ORAL | Status: AC | PRN

## 2017-08-05 MED ORDER — DIPHENHYDRAMINE HCL 50 MG/ML IJ SOLN: 12.5000 mg | INTRAMUSCULAR | 0 refills | Status: AC | PRN

## 2017-08-05 MED ORDER — ONDANSETRON HCL 4 MG PO TABS: 4.0000 mg | ORAL_TABLET | Freq: Four times a day (QID) | ORAL | 0 refills | Status: AC | PRN

## 2017-08-05 MED ORDER — MEGESTROL ACETATE 400 MG/10ML PO SUSP: 400.0000 mg | Freq: Two times a day (BID) | ORAL | 0 refills | Status: AC

## 2017-08-05 MED ORDER — LORAZEPAM 1 MG PO TABS: 1.0000 mg | ORAL_TABLET | ORAL | 0 refills | Status: AC | PRN

## 2017-08-05 MED ORDER — HALOPERIDOL LACTATE 2 MG/ML PO CONC: 0.5000 mg | ORAL | 0 refills | Status: AC | PRN

## 2017-08-05 MED ORDER — BISACODYL 10 MG RE SUPP: 10.0000 mg | Freq: Every day | RECTAL | 0 refills | Status: AC | PRN

## 2017-08-05 NOTE — Progress Notes (Signed)
Patient discharged Via EMS to Hospice home. IV and Foley intact per Clydie Braun Robertson's request. Family at bedside and left with all patients belongings.

## 2017-08-05 NOTE — Progress Notes (Signed)
Follow up visit made to new hospice home referral. Patient seen quietly moaning, she has received both IV lorazepam and liquid morphine. Discussed symptom management with staff RN Morrie Sheldon, PRN haldol to be given as ordered. Emotional support given to patient's husband Verdon Cummins at bedside. Report called to the Hospice home, EMS notified for transport, discharge summary faxed to referral. Hospital care team updated. Thank you. Dayna Barker RN, BSN, [facility] [place], hospital liaison [phone]

## 2017-08-05 NOTE — Clinical Social Work Note (Signed)
Patient to discharge today to hospice home as arranged by Clydie Braun with Hospice. York Spaniel MSW,LCSW [phone]

## 2017-08-05 NOTE — Discharge Summary (Signed)
[facility] [place] [facility]   PATIENT NAME: Elizabeth [name]    MR#:  [id number]  DATE OF BIRTH:  [birth year]  DATE OF ADMISSION:  07/30/2017   ADMITTING PHYSICIAN: Houston Siren, MD  DATE OF DISCHARGE:  08/05/2017  PRIMARY CARE PHYSICIAN: [name], MD   ADMISSION DIAGNOSIS:  Acute Renal Failure, Hyperkalemia DISCHARGE DIAGNOSIS:  Active Problems:   Acute renal failure (ARF) (HCC)   Abnormal LFTs   Dysphagia   Palliative care encounter   Acute liver failure without hepatic coma  SECONDARY DIAGNOSIS:   Past Medical History:  Diagnosis Date  . Diabetes mellitus without complication (HCC)   . Hyperlipidemia   . Hypertension   . Peripheral arterial disease [facility])    HOSPITAL COURSE:  84 year old female with past medical history of diabetes, hypertension, peripheral artery disease, recent admission for hyponatremia, chronic kidney disease stage III who presents to the hospital from direct admission due to altered mental status/encephalopathy and acute on chronic renal failure.  *Acute toxic metabolic encephalopathy Compounded by dementia Most likely secondary to acutehepatorenal failure After long discussion with the patient's husband-patient was made comfort care going forward with plans for placement in a facility with hospice services Palliative/hospice input appreciated-currently awaiting hospice bed opening  *AKI w/ CKD IV Continue worsening noted Nephrology input appreciated Baseline creatinine 2 Plan of care per above, not interested in dialysis  *Acute hyperkalemia Plan of care per above  *Acuteliver failure Plan of care per above Suspect due to fatty liverand medication side effect-Zetia/Niacin/Colchicine Liver ultrasound noted for gallstones without pathology/complication Gastroenterology input appreciated  *Acute thrombocytopenia Plan of care per above Oncologyinput appreciated  *Chronic diabetes mellitus  type 2 Plan of care per above  *Chronic dementia Plan of care per above  *Chronic hyperlipidemia, unspecified Plan of care per above  *Chronic benign essential hypertension Plan of care per above  *Chronic pressure wound Stage I-II sacral wound Continue local wound care Plan of care stated above  Poor prognosis. Per palliative care, hospice home placement. DISCHARGE CONDITIONS:  Poor prognosis, discharge to hospice home today. CONSULTS OBTAINED:  Treatment Team:  Mosetta Pigeon, MD Jeralyn Ruths, MD Toney Reil, MD Wyline Mood, MD DRUG ALLERGIES:   Allergies  Allergen Reactions  . Meperidine Nausea Only   DISCHARGE MEDICATIONS:   Allergies as of 08/05/2017      Reactions   Meperidine Nausea Only      Medication List    STOP taking these medications   aspirin EC 81 MG tablet   calcium-vitamin D 500-200 MG-UNIT tablet   colchicine 0.6 MG tablet   ezetimibe 10 MG tablet Commonly known as:  ZETIA   metoprolol succinate 50 MG 24 hr tablet Commonly known as:  TOPROL-XL   niacin 500 MG CR tablet Commonly known as:  NIASPAN   sitaGLIPtin 25 MG tablet Commonly known as:  JANUVIA   sodium chloride 1 g tablet     TAKE these medications   acetaminophen 325 MG tablet Commonly known as:  TYLENOL Take 2 tablets (650 mg total) by mouth every 6 (six) hours as needed for mild pain (or Fever >/= 101).   bisacodyl 10 MG suppository Commonly known as:  DULCOLAX Place 1 suppository (10 mg total) rectally daily as needed for moderate constipation.   chlorproMAZINE 12.5 mg in sodium chloride 0.9 % 25 mL Inject 12.5 mg into the vein every 6 (six) hours as needed.   diphenhydrAMINE 50 MG/ML injection Commonly known as:  BENADRYL Inject 0.25 mLs (12.5 mg total) into the vein every 4 (four) hours as needed for itching.   glycopyrrolate 1 MG tablet Commonly known as:  ROBINUL Take 1 tablet (1 mg total) by mouth every 4 (four) hours as needed  (excessive secretions).   glycopyrrolate 0.2 MG/ML injection Commonly known as:  ROBINUL Inject 1 mL (0.2 mg total) into the skin every 4 (four) hours as needed (excessive secretions).   glycopyrrolate 0.2 MG/ML injection Commonly known as:  ROBINUL Inject 1 mL (0.2 mg total) into the vein every 4 (four) hours as needed (excessive secretions).   haloperidol 0.5 MG tablet Commonly known as:  HALDOL Take 1 tablet (0.5 mg total) by mouth every 4 (four) hours as needed for agitation (or delirium).   haloperidol 2 MG/ML solution Commonly known as:  HALDOL Take 1 mL (2 mg total) by mouth 2 (two) times daily.   haloperidol 2 MG/ML solution Commonly known as:  HALDOL Place 0.3 mLs (0.6 mg total) under the tongue every 4 (four) hours as needed for agitation (or delirium).   haloperidol lactate 5 MG/ML injection Commonly known as:  HALDOL Inject 0.1 mLs (0.5 mg total) into the vein every 4 (four) hours as needed (or delirium).   loperamide 2 MG capsule Commonly known as:  IMODIUM Take 1 capsule (2 mg total) by mouth every 2 (two) hours as needed for diarrhea or loose stools.   LORazepam 2 MG/ML injection Commonly known as:  ATIVAN Inject 0.5 mLs (1 mg total) into the vein every 4 (four) hours as needed for seizure.   LORazepam 1 MG tablet Commonly known as:  ATIVAN Take 1 tablet (1 mg total) by mouth every 4 (four) hours as needed for anxiety.   LORazepam 2 MG/ML concentrated solution Commonly known as:  ATIVAN Place 0.5 mLs (1 mg total) under the tongue every 4 (four) hours as needed for anxiety.   LORazepam 2 MG/ML injection Commonly known as:  ATIVAN Inject 0.5 mLs (1 mg total) into the vein every 4 (four) hours as needed for anxiety.   megestrol 400 MG/10ML suspension Commonly known as:  MEGACE Take 10 mLs (400 mg total) by mouth 2 (two) times daily.   morphine CONCENTRATE 10 MG/0.5ML Soln concentrated solution Take 0.25 mLs (5 mg total) by mouth every 2 (two) hours as  needed for moderate pain (or dyspnea).   morphine CONCENTRATE 10 MG/0.5ML Soln concentrated solution Place 0.25 mLs (5 mg total) under the tongue every 2 (two) hours as needed for moderate pain (or dyspnea).   mouth rinse Liqd solution 15 mLs by Mouth Rinse route 2 (two) times daily.   antiseptic oral rinse Liqd Apply 15 mLs topically as needed for dry mouth.   ondansetron 4 MG tablet Commonly known as:  ZOFRAN Take 1 tablet (4 mg total) by mouth every 6 (six) hours as needed for nausea.   ondansetron 4 MG/2ML Soln injection Commonly known as:  ZOFRAN Inject 2 mLs (4 mg total) into the vein every 6 (six) hours as needed for nausea.   polyvinyl alcohol 1.4 % ophthalmic solution Commonly known as:  LIQUIFILM TEARS Place 1 drop into both eyes 4 (four) times daily as needed for dry eyes.   traZODone 50 MG tablet Commonly known as:  DESYREL Take 0.5 tablets (25 mg total) by mouth at bedtime as needed for sleep.        DISCHARGE INSTRUCTIONS:  See AVS.  If you experience worsening of your admission symptoms, develop shortness  of breath, life threatening emergency, suicidal or homicidal thoughts you must seek medical attention immediately by calling 911 or calling your MD immediately  if symptoms less severe.  You Must read complete instructions/literature along with all the possible adverse reactions/side effects for all the Medicines you take and that have been prescribed to you. Take any new Medicines after you have completely understood and accpet all the possible adverse reactions/side effects.   Please note  You were cared for by a hospitalist during your hospital stay. If you have any questions about your discharge medications or the care you received while you were in the hospital after you are discharged, you can call the unit and asked to speak with the hospitalist on call if the hospitalist that took care of you is not available. Once you are discharged, your primary care  physician will handle any further medical issues. Please note that NO REFILLS for any discharge medications will be authorized once you are discharged, as it is imperative that you return to your primary care physician (or establish a relationship with a primary care physician if you do not have one) for your aftercare needs so that they can reassess your need for medications and monitor your lab values.    On the day of Discharge:  VITAL SIGNS:  Blood pressure (!) 114/40, pulse 89, temperature 98.7 F (37.1 C), temperature source Axillary, resp. rate 20, height 5\' 4"  (1.626 m), weight 156 lb 8.4 oz (71 kg), SpO2 95 %. DATA REVIEW:   CBC Recent Labs  Lab 08/03/17 0737  WBC 13.6*  HGB 11.2*  HCT 35.5  PLT 54*    Chemistries  Recent Labs  Lab 08/03/17 0737  NA 139  K 5.8*  CL 100*  CO2 19*  GLUCOSE 199*  BUN 141*  CREATININE 3.79*  CALCIUM 9.4  AST 500*  ALT 1,160*  ALKPHOS 153*  BILITOT 4.9*     Microbiology Results  Results for orders placed or performed during the hospital encounter of 07/30/17  Urine Culture     Status: Abnormal   Collection Time: 08/01/17  5:53 AM  Result Value Ref Range Status   Specimen Description   Final    URINE, RANDOM Performed at [facility], [address] [place], [place] [address]    Special Requests   Final    NONE Performed at [facility], [address] [place], [place] [address]    Culture 40,000 COLONIES/mL ESCHERICHIA COLI (A)  Final   Report Status 08/03/2017 FINAL  Final   Organism ID, Bacteria ESCHERICHIA COLI (A)  Final      Susceptibility   Escherichia coli - MIC*    AMPICILLIN 8 SENSITIVE Sensitive     CEFAZOLIN <=4 SENSITIVE Sensitive     CEFTRIAXONE <=1 SENSITIVE Sensitive     CIPROFLOXACIN <=0.25 SENSITIVE Sensitive     GENTAMICIN <=1 SENSITIVE Sensitive     IMIPENEM <=0.25 SENSITIVE Sensitive     NITROFURANTOIN <=16 SENSITIVE Sensitive     TRIMETH/SULFA <=20 SENSITIVE Sensitive      AMPICILLIN/SULBACTAM 4 SENSITIVE Sensitive     PIP/TAZO <=4 SENSITIVE Sensitive     Extended ESBL NEGATIVE Sensitive     * 40,000 COLONIES/mL ESCHERICHIA COLI    RADIOLOGY:  No results found.   Management plans discussed with the patient, family and they are in agreement.  CODE STATUS: DNR   TOTAL TIME TAKING CARE OF THIS PATIENT: 35 minutes.    Shaune Pollack M.D on 08/05/2017  at 9:23 AM  Between 7am to 6pm - Pager - [phone]  After 6pm go to [email or web address] - Social research officer, government  [facility]  Office  [phone]  CC: Primary care physician; [name], MD   Note: This dictation was prepared with Dragon dictation along with smaller phrase technology. Any transcriptional errors that result from this process are unintentional.

## 2017-08-06 LAB — HEPATITIS C VRS RNA DETECT BY PCR-QUAL: HEPATITIS C VRS RNA BY PCR-QUAL: NEGATIVE

## 2017-08-06 LAB — VARICELLA-ZOSTER BY PCR: Varicella-Zoster, PCR: NEGATIVE

## 2017-08-18 DEATH — deceased

## 2018-03-05 ENCOUNTER — Ambulatory Visit (INDEPENDENT_AMBULATORY_CARE_PROVIDER_SITE_OTHER): Payer: Medicare Other | Admitting: Vascular Surgery

## 2018-03-05 ENCOUNTER — Encounter (INDEPENDENT_AMBULATORY_CARE_PROVIDER_SITE_OTHER): Payer: Medicare Other
